# Patient Record
Sex: Female | Born: 1960
Health system: Southern US, Community
[De-identification: ages and names within clinical notes are randomized; demographics above are authoritative.]

## PROBLEM LIST (undated history)

## (undated) DIAGNOSIS — T7840XA Allergy, unspecified, initial encounter: Secondary | ICD-10-CM

## (undated) DIAGNOSIS — I10 Essential (primary) hypertension: Secondary | ICD-10-CM

## (undated) DIAGNOSIS — E7800 Pure hypercholesterolemia, unspecified: Secondary | ICD-10-CM

## (undated) HISTORY — PX: TONSILLECTOMY: SUR1361

## (undated) HISTORY — PX: TUBAL LIGATION: SHX77

## (undated) HISTORY — DX: Allergy, unspecified, initial encounter: T78.40XA

---

## 2014-06-10 ENCOUNTER — Emergency Department (HOSPITAL_COMMUNITY)
Admission: EM | Admit: 2014-06-10 | Discharge: 2014-06-10 | Disposition: A | Discharge status: Home / Self Care | Payer: BC Managed Care – PPO | Attending: Emergency Medicine | Admitting: Emergency Medicine

## 2014-06-10 ENCOUNTER — Emergency Department (HOSPITAL_COMMUNITY): Payer: BC Managed Care – PPO

## 2014-06-10 ENCOUNTER — Encounter (HOSPITAL_COMMUNITY): Payer: Self-pay | Admitting: Emergency Medicine

## 2014-06-10 DIAGNOSIS — Z8639 Personal history of other endocrine, nutritional and metabolic disease: Secondary | ICD-10-CM | POA: Diagnosis not present

## 2014-06-10 DIAGNOSIS — R42 Dizziness and giddiness: Secondary | ICD-10-CM

## 2014-06-10 DIAGNOSIS — I158 Other secondary hypertension: Secondary | ICD-10-CM | POA: Insufficient documentation

## 2014-06-10 DIAGNOSIS — Z7982 Long term (current) use of aspirin: Secondary | ICD-10-CM | POA: Diagnosis not present

## 2014-06-10 DIAGNOSIS — Z79899 Other long term (current) drug therapy: Secondary | ICD-10-CM | POA: Diagnosis not present

## 2014-06-10 DIAGNOSIS — Z862 Personal history of diseases of the blood and blood-forming organs and certain disorders involving the immune mechanism: Secondary | ICD-10-CM | POA: Diagnosis not present

## 2014-06-10 DIAGNOSIS — I159 Secondary hypertension, unspecified: Secondary | ICD-10-CM

## 2014-06-10 HISTORY — DX: Essential (primary) hypertension: I10

## 2014-06-10 HISTORY — DX: Pure hypercholesterolemia, unspecified: E78.00

## 2014-06-10 LAB — BASIC METABOLIC PANEL
ANION GAP: 14 (ref 5–15)
BUN: 20 mg/dL (ref 6–23)
CHLORIDE: 100 meq/L (ref 96–112)
CO2: 26 meq/L (ref 19–32)
Calcium: 10.2 mg/dL (ref 8.4–10.5)
Creatinine, Ser: 0.93 mg/dL (ref 0.50–1.10)
GFR calc Af Amer: 79 mL/min — ABNORMAL LOW (ref >90)
GFR calc non Af Amer: 68 mL/min — ABNORMAL LOW (ref >90)
Glucose, Bld: 96 mg/dL (ref 70–99)
Potassium: 4.2 mEq/L (ref 3.7–5.3)
Sodium: 140 mEq/L (ref 137–147)

## 2014-06-10 LAB — URINALYSIS, ROUTINE W REFLEX MICROSCOPIC
Bilirubin Urine: NEGATIVE
Glucose, UA: NEGATIVE mg/dL
Hgb urine dipstick: NEGATIVE
KETONES UR: NEGATIVE mg/dL
LEUKOCYTES UA: NEGATIVE
NITRITE: NEGATIVE
Protein, ur: NEGATIVE mg/dL
SPECIFIC GRAVITY, URINE: 1.008 (ref 1.005–1.030)
Urobilinogen, UA: 0.2 mg/dL (ref 0.0–1.0)
pH: 6.5 (ref 5.0–8.0)

## 2014-06-10 LAB — CBC WITH DIFFERENTIAL/PLATELET
Basophils Absolute: 0 10*3/uL (ref 0.0–0.1)
Basophils Relative: 1 % (ref 0–1)
Eosinophils Absolute: 0.2 10*3/uL (ref 0.0–0.7)
Eosinophils Relative: 3 % (ref 0–5)
HCT: 38.4 % (ref 36.0–46.0)
Hemoglobin: 13.6 g/dL (ref 12.0–15.0)
LYMPHS PCT: 38 % (ref 12–46)
Lymphs Abs: 2.2 10*3/uL (ref 0.7–4.0)
MCH: 30.7 pg (ref 26.0–34.0)
MCHC: 35.4 g/dL (ref 30.0–36.0)
MCV: 86.7 fL (ref 78.0–100.0)
Monocytes Absolute: 0.3 10*3/uL (ref 0.1–1.0)
Monocytes Relative: 6 % (ref 3–12)
NEUTROS ABS: 3.2 10*3/uL (ref 1.7–7.7)
NEUTROS PCT: 52 % (ref 43–77)
Platelets: 329 10*3/uL (ref 150–400)
RBC: 4.43 MIL/uL (ref 3.87–5.11)
RDW: 12.6 % (ref 11.5–15.5)
WBC: 5.9 10*3/uL (ref 4.0–10.5)

## 2014-06-10 LAB — I-STAT TROPONIN, ED: TROPONIN I, POC: 0 ng/mL (ref 0.00–0.08)

## 2014-06-10 MED ORDER — SODIUM CHLORIDE 0.9 % IV BOLUS (SEPSIS)
500.0000 mL | Freq: Once | INTRAVENOUS | Status: AC
  Administered 2014-06-10: 500 mL via INTRAVENOUS

## 2014-06-10 NOTE — ED Notes (Signed)
Initial contact-A&Ox4. Moving all extremities. C/o slight dizziness at this time. Denies SOB, chest pain at this time. PIV inserted. No other complaints/concerns. Aware urine sample is needed. Does not need to use the restroom at this time. Will continue to monitor.

## 2014-06-10 NOTE — ED Notes (Addendum)
Pt c/o HTN and dizziness x 0630 this morning. Denies pain. Pt does take lisinopril. Pt went to [facility] and was given zofran for nausea and that has ceased.

## 2014-06-10 NOTE — Discharge Instructions (Signed)
Near-Syncope Near-syncope (commonly known as near fainting) is sudden weakness, dizziness, or feeling like you might pass out. During an episode of near-syncope, you may also develop pale skin, have tunnel vision, or feel sick to your stomach (nauseous). Near-syncope may occur when getting up after sitting or while standing for a long time. It is caused by a sudden decrease in blood flow to the brain. This decrease can result from various causes or triggers, most of which are not serious. However, because near-syncope can sometimes be a sign of something serious, a medical evaluation is required. The specific cause is often not determined. HOME CARE INSTRUCTIONS  Monitor your condition for any changes. The following actions may help to alleviate any discomfort you are experiencing:  Have someone stay with you until you feel stable.  Lie down right away and prop your feet up if you start feeling like you might faint. Breathe deeply and steadily. Wait until all the symptoms have passed. Most of these episodes last only a few minutes. You may feel tired for several hours.   Drink enough fluids to keep your urine clear or pale yellow.   If you are taking blood pressure or heart medicine, get up slowly when seated or lying down. Take several minutes to sit and then stand. This can reduce dizziness.  Follow up with your health care provider as directed. SEEK IMMEDIATE MEDICAL CARE IF:   You have a severe headache.   You have unusual pain in the chest, abdomen, or back.   You are bleeding from the mouth or rectum, or you have black or tarry stool.   You have an irregular or very fast heartbeat.   You have repeated fainting or have seizure-like jerking during an episode.   You faint when sitting or lying down.   You have confusion.   You have difficulty walking.   You have severe weakness.   You have vision problems.  MAKE SURE YOU:   Understand these instructions.  Will  watch your condition.  Will get help right away if you are not doing well or get worse. Document Released: 09/17/2005 Document Revised: 09/22/2013 Document Reviewed: 02/20/2013 [facility] Patient Information 2015 [place], [place]. This information is not intended to replace advice given to you by your health care provider. Make sure you discuss any questions you have with your health care provider. Hypertension Hypertension, commonly called high blood pressure, is when the force of blood pumping through your arteries is too strong. Your arteries are the blood vessels that carry blood from your heart throughout your body. A blood pressure reading consists of a higher number over a lower number, such as 110/72. The higher number (systolic) is the pressure inside your arteries when your heart pumps. The lower number (diastolic) is the pressure inside your arteries when your heart relaxes. Ideally you want your blood pressure below 120/80. Hypertension forces your heart to work harder to pump blood. Your arteries may become narrow or stiff. Having hypertension puts you at risk for heart disease, stroke, and other problems.  RISK FACTORS Some risk factors for high blood pressure are controllable. Others are not.  Risk factors you cannot control include:   Race. You may be at higher risk if you are African American.  Age. Risk increases with age.  Gender. Men are at higher risk than women before age 16 years. After age 11, women are at higher risk than men. Risk factors you can control include:  Not getting enough exercise or  physical activity.  Being overweight.  Getting too much fat, sugar, calories, or salt in your diet.  Drinking too much alcohol. SIGNS AND SYMPTOMS Hypertension does not usually cause signs or symptoms. Extremely high blood pressure (hypertensive crisis) may cause headache, anxiety, shortness of breath, and nosebleed. DIAGNOSIS  To check if you have hypertension, your health  care provider will measure your blood pressure while you are seated, with your arm held at the level of your heart. It should be measured at least twice using the same arm. Certain conditions can cause a difference in blood pressure between your right and left arms. A blood pressure reading that is higher than normal on one occasion does not mean that you need treatment. If one blood pressure reading is high, ask your health care provider about having it checked again. TREATMENT  Treating high blood pressure includes making lifestyle changes and possibly taking medicine. Living a healthy lifestyle can help lower high blood pressure. You may need to change some of your habits. Lifestyle changes may include:  Following the DASH diet. This diet is high in fruits, vegetables, and whole grains. It is low in salt, red meat, and added sugars.  Getting at least 2 hours of brisk physical activity every week.  Losing weight if necessary.  Not smoking.  Limiting alcoholic beverages.  Learning ways to reduce stress. If lifestyle changes are not enough to get your blood pressure under control, your health care provider may prescribe medicine. You may need to take more than one. Work closely with your health care provider to understand the risks and benefits. HOME CARE INSTRUCTIONS  Have your blood pressure rechecked as directed by your health care provider.   Take medicines only as directed by your health care provider. Follow the directions carefully. Blood pressure medicines must be taken as prescribed. The medicine does not work as well when you skip doses. Skipping doses also puts you at risk for problems.   Do not smoke.   Monitor your blood pressure at home as directed by your health care provider. SEEK MEDICAL CARE IF:   You think you are having a reaction to medicines taken.  You have recurrent headaches or feel dizzy.  You have swelling in your ankles.  You have trouble with your  vision. SEEK IMMEDIATE MEDICAL CARE IF:  You develop a severe headache or confusion.  You have unusual weakness, numbness, or feel faint.  You have severe chest or abdominal pain.  You vomit repeatedly.  You have trouble breathing. MAKE SURE YOU:   Understand these instructions.  Will watch your condition.  Will get help right away if you are not doing well or get worse. Document Released: 09/17/2005 Document Revised: 02/01/2014 Document Reviewed: 07/10/2013 [facility] Patient Information 2015 [place], [place]. This information is not intended to replace advice given to you by your health care provider. Make sure you discuss any questions you have with your health care provider.

## 2014-06-10 NOTE — ED Provider Notes (Signed)
CSN: [id number]     Arrival date & time 06/10/14  4098 History   First MD Initiated Contact with Patient 06/10/14 1121     Chief Complaint  Patient presents with  . Hypertension  . Dizziness     (Consider location/radiation/quality/duration/timing/severity/associated sxs/prior Treatment) HPI Comments: Patient states she woke up about 5 AM with nausea. At about 6:30 AM she went from sitting to standing and became lightheaded and felt like she was going to pass out. This sensation lasted for about 30 minutes. She presented to the local urgent care where she had an EKG, was given aspirin and Zofran and was asked to present to the emergency department. She denies chest pain, shortness of breath, focal numbness, weakness, fevers, chills, myalgias or recent illnesses. She states urgent care her blood pressure was much more elevated than it usually is with a  Read of 159/98. She has since taken her home lisinopril  Patient is a 53 y.o. female presenting with dizziness and near-syncope. The history is provided by the patient. No language interpreter was used.  Dizziness Associated symptoms: nausea   Associated symptoms: no chest pain, no diarrhea, no headaches, no palpitations, no shortness of breath and no vomiting   Near Syncope This is a new problem. The current episode started 3 to 5 hours ago. The problem occurs rarely. The problem has been resolved. Pertinent negatives include no chest pain, no abdominal pain, no headaches and no shortness of breath. Exacerbated by: standing. The symptoms are relieved by rest. She has tried rest for the symptoms. The treatment provided significant relief.    Past Medical History  Diagnosis Date  . Hypertension   . Hypercholesteremia    Past Surgical History  Procedure Laterality Date  . Tubal ligation    . Tonsillectomy     No family history on file. History  Substance Use Topics  . Smoking status: Never Smoker   . Smokeless tobacco: Not on file  .  Alcohol Use: Yes     Comment: social   OB History   Grav Para Term Preterm Abortions TAB SAB Ect Mult Living                 Review of Systems  Constitutional: Negative for fever, chills, diaphoresis, activity change, appetite change and fatigue.  HENT: Negative for congestion, facial swelling, rhinorrhea and sore throat.   Eyes: Negative for photophobia and discharge.  Respiratory: Negative for cough, chest tightness and shortness of breath.   Cardiovascular: Positive for near-syncope. Negative for chest pain, palpitations and leg swelling.  Gastrointestinal: Positive for nausea. Negative for vomiting, abdominal pain and diarrhea.  Endocrine: Negative for polydipsia and polyuria.  Genitourinary: Negative for dysuria, frequency, difficulty urinating and pelvic pain.  Musculoskeletal: Negative for arthralgias, back pain, neck pain and neck stiffness.  Skin: Negative for color change and wound.  Allergic/Immunologic: Negative for immunocompromised state.  Neurological: Positive for dizziness. Negative for facial asymmetry, weakness, numbness and headaches.  Hematological: Does not bruise/bleed easily.  Psychiatric/Behavioral: Negative for confusion and agitation.      Allergies  Review of patient's allergies indicates no known allergies.  Home Medications   Prior to Admission medications   Medication Sig Start Date End Date Taking? Authorizing Provider  aspirin EC 81 MG tablet Take 81 mg by mouth once.   Yes Historical Provider, MD  lisinopril-hydrochlorothiazide (PRINZIDE,ZESTORETIC) 10-12.5 MG per tablet Take 1 tablet by mouth daily.   Yes Historical Provider, MD   BP 146/78  Pulse 67  Temp(Src) 98 F (36.7 C) (Oral)  Resp 11  SpO2 100% Physical Exam  Constitutional: She is oriented to person, place, and time. She appears well-developed and well-nourished. No distress.  HENT:  Head: Normocephalic and atraumatic.  Mouth/Throat: No oropharyngeal exudate.  Eyes: Pupils  are equal, round, and reactive to light.  Neck: Normal range of motion. Neck supple.  Cardiovascular: Normal rate, regular rhythm and normal heart sounds.  Exam reveals no gallop and no friction rub.   No murmur heard. Pulmonary/Chest: Effort normal and breath sounds normal. No respiratory distress. She has no wheezes. She has no rales.  Abdominal: Soft. Bowel sounds are normal. She exhibits no distension and no mass. There is no tenderness. There is no rebound and no guarding.  Musculoskeletal: Normal range of motion. She exhibits no edema and no tenderness.  Neurological: She is alert and oriented to person, place, and time. She has normal strength. She displays no tremor. No cranial nerve deficit or sensory deficit. She exhibits normal muscle tone. She displays a negative Romberg sign. Coordination and gait normal. GCS eye subscore is 4. GCS verbal subscore is 5. GCS motor subscore is 6.  Skin: Skin is warm and dry.  Psychiatric: She has a normal mood and affect.    ED Course  Procedures (including critical care time) Labs Review Labs Reviewed  BASIC METABOLIC PANEL - Abnormal; Notable for the following:    GFR calc non Af Amer 68 (*)    GFR calc Af Amer 79 (*)    All other components within normal limits  URINE CULTURE  CBC WITH DIFFERENTIAL  URINALYSIS, ROUTINE W REFLEX MICROSCOPIC  I-STAT TROPOININ, ED    Imaging Review Dg Chest 2 View  06/10/2014   CLINICAL DATA:  Hypertension.  Near syncopal episode.  EXAM: CHEST  2 VIEW  COMPARISON:  None.  FINDINGS: The heart size and mediastinal contours are within normal limits. Both lungs are clear. The visualized skeletal structures are unremarkable.  IMPRESSION: No active cardiopulmonary disease.   Electronically Signed   By: Lawrence Santiago M.D.   On: 06/10/2014 13:42     EKG Interpretation   Date/Time:  Thursday June 10 2014 11:38:49 EDT Ventricular Rate:  68 PR Interval:  148 QRS Duration: 71 QT Interval:  401 QTC  Calculation: 426 R Axis:   55 Text Interpretation:  Sinus rhythm Baseline wander in lead(s) II III aVF  No prior for comparison Confirmed by DOCHERTY  MD, MEGAN ([id number]) on  06/10/2014 12:05:32 PM      MDM   Final diagnoses:  Episodic lightheadedness  Secondary hypertension, unspecified    Pt is a 53 y.o. female with Pmhx as above who presents with lightheadedness. Patient states she woke up around 5:30 this morning and was nauseated and then at approximately 6:30 this morning she went from sitting to standing and became lightheaded feeling like she was going to pass out. She denies having chest pain, palpitations, shortness of breath, diaphoresis. She states that she felt lightheaded for about 30 minutes. She went to urgent care where her blood pressure was elevated. She was given aspirin and Zofran with improvement of symptoms. On physical exam vitals are stable she is in no acute distress. Cardiopulmonary informed her exam is benign. She is asymptomatic with ambulation and is not ataxic. EKG with no acute ischemic findings. CBC, BMP, UA unremarkable. I-STAT troponin neg, chest x-ray neg. Symptoms resolved after 500cc IVF. Orthostatics weakly positive. Will d/c home, and instruct her to start a  BP long, f/u with her PCP. Return precautions given for new or worsening symptoms including CP, SOB, numbness, weakness.          Ernestina Patches, MD 06/10/14 [id number]

## 2014-06-11 LAB — URINE CULTURE

## 2014-12-29 ENCOUNTER — Ambulatory Visit (INDEPENDENT_AMBULATORY_CARE_PROVIDER_SITE_OTHER): Payer: 59 | Admitting: Family Medicine

## 2014-12-29 ENCOUNTER — Encounter: Payer: Self-pay | Admitting: Family Medicine

## 2014-12-29 ENCOUNTER — Ambulatory Visit (INDEPENDENT_AMBULATORY_CARE_PROVIDER_SITE_OTHER): Payer: 59

## 2014-12-29 VITALS — BP 124/76 | HR 84 | Temp 98.2°F | Resp 17 | Ht 64.0 in | Wt 136.0 lb

## 2014-12-29 DIAGNOSIS — R52 Pain, unspecified: Secondary | ICD-10-CM | POA: Diagnosis not present

## 2014-12-29 DIAGNOSIS — R6883 Chills (without fever): Secondary | ICD-10-CM

## 2014-12-29 DIAGNOSIS — R002 Palpitations: Secondary | ICD-10-CM | POA: Diagnosis not present

## 2014-12-29 LAB — POCT INFLUENZA A/B
INFLUENZA A, POC: NEGATIVE
Influenza B, POC: NEGATIVE

## 2014-12-29 LAB — COMPREHENSIVE METABOLIC PANEL
ALK PHOS: 79 U/L (ref 39–117)
ALT: 19 U/L (ref 0–35)
AST: 20 U/L (ref 0–37)
Albumin: 4.3 g/dL (ref 3.5–5.2)
BUN: 23 mg/dL (ref 6–23)
CALCIUM: 9.8 mg/dL (ref 8.4–10.5)
CO2: 30 mEq/L (ref 19–32)
CREATININE: 0.94 mg/dL (ref 0.50–1.10)
Chloride: 101 mEq/L (ref 96–112)
Glucose, Bld: 84 mg/dL (ref 70–99)
Potassium: 4.2 mEq/L (ref 3.5–5.3)
SODIUM: 141 meq/L (ref 135–145)
Total Bilirubin: 0.5 mg/dL (ref 0.2–1.2)
Total Protein: 6.9 g/dL (ref 6.0–8.3)

## 2014-12-29 LAB — POCT CBC
Granulocyte percent: 61.3 %G (ref 37–80)
HEMATOCRIT: 42.1 % (ref 37.7–47.9)
HEMOGLOBIN: 13.6 g/dL (ref 12.2–16.2)
Lymph, poc: 2.3 (ref 0.6–3.4)
MCH: 29.4 pg (ref 27–31.2)
MCHC: 32.3 g/dL (ref 31.8–35.4)
MCV: 90.8 fL (ref 80–97)
MID (cbc): 0.5 (ref 0–0.9)
MPV: 6.9 fL (ref 0–99.8)
POC Granulocyte: 4.4 (ref 2–6.9)
POC LYMPH PERCENT: 31.8 %L (ref 10–50)
POC MID %: 6.9 %M (ref 0–12)
Platelet Count, POC: 371 10*3/uL (ref 142–424)
RBC: 4.64 M/uL (ref 4.04–5.48)
RDW, POC: 13.6 %
WBC: 7.1 10*3/uL (ref 4.6–10.2)

## 2014-12-29 LAB — TSH: TSH: 0.723 u[IU]/mL (ref 0.350–4.500)

## 2014-12-29 NOTE — Progress Notes (Signed)
[facility] [address] [place] [address] [phone]  Date:  12/29/2014   Name:  Haley Vega   DOB:  1961-05-02   MRN:  [id number]  PCP:  Helane Rima, MD    Chief Complaint: Fatigue; Chills; and Palpitations   History of Present Illness:  Haley Vega is a 54 y.o. very pleasant female patient who presents with the following:  She has noted chills, body aches, heart racing.   She has been sick for about 5 days now.  She is not sure about a fever.  She has tried some ibuprofen and aleve when she feels bad, has not checked her temperature so she does not know about fever. No cough, no ST, no URI sx.   She notes aches in her chest and back when she lays down at night.   She did take some ibuprofen at 0300 this am.   No GI symptoms.   She has a history of HTN but no other heart problems.  No history of exertional CP.   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   Past Medical History  Diagnosis Date  . Hypertension   . Hypercholesteremia     Past Surgical History  Procedure Laterality Date  . Tubal ligation    . Tonsillectomy      History  Substance Use Topics  . Smoking status: Never Smoker   . Smokeless tobacco: Not on file  . Alcohol Use: Yes     Comment: social    No family history on file.  No Known Allergies  Medication list has been reviewed and updated.  Current Outpatient Prescriptions on File Prior to Visit  Medication Sig Dispense Refill  . aspirin EC 81 MG tablet Take 81 mg by mouth once.    Marland Kitchen lisinopril-hydrochlorothiazide (PRINZIDE,ZESTORETIC) 10-12.5 MG per tablet Take 1 tablet by mouth daily.     No current facility-administered medications on file prior to visit.    Review of Systems:  As per HPI- otherwise negative.   Physical Examination: Filed Vitals:   12/29/14 0846  BP: 124/76  Pulse:  84  Temp: 98.2 F (36.8 C)  Resp: 17   Filed Vitals:   12/29/14 0846  Height: 5\' 4"  (1.626 m)  Weight: 136 lb (61.689 kg)   Body mass index is 23.33 kg/(m^2). Ideal Body Weight: Weight in (lb) to have BMI = 25: 145.3  GEN: WDWN, NAD, Non-toxic, A & O x 3, looks well, normal weight HEENT: Atraumatic, Normocephalic. Neck supple. No masses, No LAD.  Bilateral TM wnl, oropharynx normal.  PEERL,EOMI.   Ears and Nose: No external deformity. CV: RRR, No M/G/R. No JVD. No thrill. No extra heart sounds. PULM: CTA B, no wheezes, crackles, rhonchi. No retractions. No resp. distress. No accessory muscle use. ABD: S, NT, ND, +BS. No rebound. No HSM.  Benign belly EXTR: No c/c/e NEURO Normal gait.  PSYCH: Normally interactive. Conversant. Not depressed or anxious appearing.  Calm demeanor.   EKG:  NSR with rate of 70  [facility] reading (PRIMARY) by  Dr. Lorelei Pont. CXR:  Negative CHEST 2 VIEW  COMPARISON: None.  FINDINGS: The lungs are well-expanded and clear. The heart and mediastinal structures are normal. The pulmonary vascularity is normal. There is no pleural effusion. The bony thorax is unremarkable.  IMPRESSION: There is no active cardiopulmonary  disease.    Results for orders placed or performed in visit on 12/29/14  POCT Influenza A/B  Result Value Ref Range   Influenza A, POC Negative    Influenza B, POC Negative   POCT CBC  Result Value Ref Range   WBC 7.1 4.6 - 10.2 K/uL   Lymph, poc 2.3 0.6 - 3.4   POC LYMPH PERCENT 31.8 10 - 50 %L   MID (cbc) 0.5 0 - 0.9   POC MID % 6.9 0 - 12 %M   POC Granulocyte 4.4 2 - 6.9   Granulocyte percent 61.3 37 - 80 %G   RBC 4.64 4.04 - 5.48 M/uL   Hemoglobin 13.6 12.2 - 16.2 g/dL   HCT, POC 42.1 37.7 - 47.9 %   MCV 90.8 80 - 97 fL   MCH, POC 29.4 27 - 31.2 pg   MCHC 32.3 31.8 - 35.4 g/dL   RDW, POC 13.6 %   Platelet Count, POC 371 142 - 424 K/uL   MPV 6.9 0 - 99.8 fL    Assessment and Plan: Chills - Plan: POCT Influenza A/B,  POCT CBC, Comprehensive metabolic panel, DG Chest 2 View  Body aches - Plan: POCT Influenza A/B, DG Chest 2 View  Palpitations - Plan: EKG 12-Lead, TSH  Non- specific sx of heart racing, chills and fatigue.  Advised that at this time no definite cause is evident.  She will rest and drink plenty of fluids, and I will be in touch with the rest of her labs.  Any worsening and she will let me know See patient instructions for more details.     Signed Lamar Blinks, MD

## 2014-12-29 NOTE — Patient Instructions (Signed)
Take it easy, drink plenty of fluids and eat a good diet.  Try to get plenty of rest.  I will be in touch with the rest of your labs If you feel chilled or ill check your temperature, and try to take your pulse if you feel that your heart is racing. If your symptoms continue to bother you we can look further.

## 2015-01-03 ENCOUNTER — Encounter: Payer: Self-pay | Admitting: Family Medicine

## 2015-01-03 ENCOUNTER — Ambulatory Visit (INDEPENDENT_AMBULATORY_CARE_PROVIDER_SITE_OTHER): Payer: 59 | Admitting: Family Medicine

## 2015-01-03 VITALS — BP 154/90 | HR 82 | Temp 98.2°F | Resp 16 | Ht 64.0 in | Wt 135.0 lb

## 2015-01-03 DIAGNOSIS — I10 Essential (primary) hypertension: Secondary | ICD-10-CM | POA: Diagnosis not present

## 2015-01-03 DIAGNOSIS — E7800 Pure hypercholesterolemia, unspecified: Secondary | ICD-10-CM

## 2015-01-03 DIAGNOSIS — E785 Hyperlipidemia, unspecified: Secondary | ICD-10-CM | POA: Insufficient documentation

## 2015-01-03 LAB — LIPID PANEL
CHOL/HDL RATIO: 3.1 ratio
Cholesterol: 275 mg/dL — ABNORMAL HIGH (ref 0–200)
HDL: 88 mg/dL (ref >=46)
LDL Cholesterol: 172 mg/dL — ABNORMAL HIGH (ref 0–99)
TRIGLYCERIDES: 74 mg/dL (ref <150)
VLDL: 15 mg/dL (ref 0–40)

## 2015-01-03 MED ORDER — AMLODIPINE BESYLATE 5 MG PO TABS: 5.0000 mg | ORAL_TABLET | Freq: Every day | ORAL | Status: DC

## 2015-01-03 NOTE — Patient Instructions (Signed)
Stop lisinopril- HCTZ Start amlodipine Check your blood pressure several times a week and keep a log How to Take Your Blood Pressure HOW DO I GET A BLOOD PRESSURE MACHINE?  You can buy an electronic home blood pressure machine at your local pharmacy. Insurance will sometimes cover the cost if you have a prescription.  Ask your doctor what type of machine is best for you. There are different machines for your arm and your wrist.  If you decide to buy a machine to check your blood pressure on your arm, first check the size of your arm so you can buy the right size cuff. To check the size of your arm:   Use a measuring tape that shows both inches and centimeters.   Wrap the measuring tape around the upper-middle part of your arm. You may need someone to help you measure.   Write down your arm measurement in both inches and centimeters.   To measure your blood pressure correctly, it is important to have the right size cuff.   If your arm is up to 13 inches (up to 34 centimeters), get an adult cuff size.  If your arm is 13 to 17 inches (35 to 44 centimeters), get a large adult cuff size.    If your arm is 17 to 20 inches (45 to 52 centimeters), get an adult thigh cuff.  WHAT DO THE NUMBERS MEAN?   There are two numbers that make up your blood pressure. For example: 120/80.  The first number (120 in our example) is called the "systolic pressure." It is a measure of the pressure in your blood vessels when your heart is pumping blood.  The second number (80 in our example) is called the "diastolic pressure." It is a measure of the pressure in your blood vessels when your heart is resting between beats.  Your doctor will tell you what your blood pressure should be. WHAT SHOULD I DO BEFORE I CHECK MY BLOOD PRESSURE?   Try to rest or relax for at least 30 minutes before you check your blood pressure.  Do not smoke.  Do not have any drinks with caffeine, such  as:  Soda.  Coffee.  Tea.  Check your blood pressure in a quiet room.  Sit down and stretch out your arm on a table. Keep your arm at about the level of your heart. Let your arm relax.  Make sure that your legs are not crossed. HOW DO I CHECK MY BLOOD PRESSURE?  Follow the directions that came with your machine.  Make sure you remove any tight-fighting clothing from your arm or wrist. Wrap the cuff around your upper arm or wrist. You should be able to fit a finger between the cuff and your arm. If you cannot fit a finger between the cuff and your arm, it is too tight and should be removed and rewrapped.  Some units require you to manually pump up the arm cuff.  Automatic units inflate the cuff when you press a button.  Cuff deflation is automatic in both models.  After the cuff is inflated, the unit measures your blood pressure and pulse. The readings are shown on a monitor. Hold still and breathe normally while the cuff is inflated.  Getting a reading takes less than a minute.  Some models store readings in a memory. Some provide a printout of readings. If your machine does not store your readings, keep a written record.  Take readings with you to your  next visit with your doctor. Document Released: 08/30/2008 Document Revised: 02/01/2014 Document Reviewed: 11/12/2013 [facility] Patient Information 2015 [place], [place]. This information is not intended to replace advice given to you by your health care provider. Make sure you discuss any questions you have with your health care provider.

## 2015-01-03 NOTE — Progress Notes (Signed)
   Subjective:    Patient ID: Haley Vega, female    DOB: 07/13/1961, 54 y.o.   MRN: [id number]  HPI This is a very pleasant female who works at the [employer] as a Research scientist (physical sciences). She presents today for follow up of palpitations. She was seen 12/29/14 with some chills and palpitations. She has not had any further episodes. Her labs, including CBC, CMET, TSH were all within normal limits. She is fasting today and would like her lipid panel checked.   She takes prinzide and reports that she has bad leg cramps at night. She would like to try a different antihypertensive. She has a blood pressure cuff at home and can check her blood pressure. She did not take her prinzide today.   Walks twice a week, will walk more when the weather improves.  Past Medical History  Diagnosis Date  . Hypertension   . Hypercholesteremia    Past Surgical History  Procedure Laterality Date  . Tubal ligation    . Tonsillectomy     History reviewed. No pertinent family history. History  Substance Use Topics  . Smoking status: Never Smoker   . Smokeless tobacco: Not on file  . Alcohol Use: Yes     Comment: social    Review of Systems No chest pain, no SOB, no edema.    Objective:   Physical Exam  Constitutional: She is oriented to person, place, and time. She appears well-developed and well-nourished.  HENT:  Head: Normocephalic and atraumatic.  Eyes: Conjunctivae are normal.  Neck: Normal range of motion. Neck supple.  Cardiovascular: Normal rate, regular rhythm and normal heart sounds.   Pulmonary/Chest: Effort normal and breath sounds normal.  Musculoskeletal: Normal range of motion. She exhibits no edema.  Neurological: She is alert and oriented to person, place, and time.  Skin: Skin is warm and dry.  Psychiatric: She has a normal mood and affect. Her behavior is normal. Judgment and thought content normal.  Vitals reviewed.  BP 154/90 mmHg  Pulse 82  Temp(Src) 98.2 F (36.8  C)  Resp 16  Ht 5\' 4"  (1.626 m)  Wt 135 lb (61.236 kg)  BMI 23.16 kg/m2  SpO2 100%     Assessment & Plan:  1. Essential hypertension - amLODipine (NORVASC) 5 MG tablet; Take 1 tablet (5 mg total) by mouth daily.  Dispense: 30 tablet; Refill: 1 - she will monitor her blood pressure periodically and bring her readings in when she has a follow up appointment in 1 month  2. Elevated cholesterol - Lipid panel   Elby Beck, FNP-BC  [facility] [place]  01/07/2015 10:39 PM

## 2015-02-14 ENCOUNTER — Ambulatory Visit (INDEPENDENT_AMBULATORY_CARE_PROVIDER_SITE_OTHER): Payer: 59 | Admitting: Family Medicine

## 2015-02-14 ENCOUNTER — Encounter: Payer: Self-pay | Admitting: Family Medicine

## 2015-02-14 VITALS — BP 124/90 | HR 73 | Temp 98.0°F | Resp 16 | Ht 64.0 in | Wt 135.8 lb

## 2015-02-14 DIAGNOSIS — Z1239 Encounter for other screening for malignant neoplasm of breast: Secondary | ICD-10-CM

## 2015-02-14 DIAGNOSIS — Z0000 Encounter for general adult medical examination without abnormal findings: Secondary | ICD-10-CM | POA: Diagnosis not present

## 2015-02-14 DIAGNOSIS — I10 Essential (primary) hypertension: Secondary | ICD-10-CM

## 2015-02-14 DIAGNOSIS — Z139 Encounter for screening, unspecified: Secondary | ICD-10-CM | POA: Diagnosis not present

## 2015-02-14 DIAGNOSIS — R252 Cramp and spasm: Secondary | ICD-10-CM

## 2015-02-14 DIAGNOSIS — E785 Hyperlipidemia, unspecified: Secondary | ICD-10-CM | POA: Diagnosis not present

## 2015-02-14 DIAGNOSIS — Z1389 Encounter for screening for other disorder: Secondary | ICD-10-CM

## 2015-02-14 LAB — COMPREHENSIVE METABOLIC PANEL
ALT: 21 U/L (ref 0–35)
AST: 22 U/L (ref 0–37)
Albumin: 4.6 g/dL (ref 3.5–5.2)
Alkaline Phosphatase: 79 U/L (ref 39–117)
BILIRUBIN TOTAL: 0.8 mg/dL (ref 0.2–1.2)
BUN: 22 mg/dL (ref 6–23)
CHLORIDE: 102 meq/L (ref 96–112)
CO2: 29 mEq/L (ref 19–32)
CREATININE: 0.92 mg/dL (ref 0.50–1.10)
Calcium: 9.7 mg/dL (ref 8.4–10.5)
Glucose, Bld: 89 mg/dL (ref 70–99)
Potassium: 4.1 mEq/L (ref 3.5–5.3)
Sodium: 138 mEq/L (ref 135–145)
Total Protein: 7.5 g/dL (ref 6.0–8.3)

## 2015-02-14 LAB — CBC
HCT: 39.9 % (ref 36.0–46.0)
Hemoglobin: 13.8 g/dL (ref 12.0–15.0)
MCH: 29.9 pg (ref 26.0–34.0)
MCHC: 34.6 g/dL (ref 30.0–36.0)
MCV: 86.4 fL (ref 78.0–100.0)
MPV: 9 fL (ref 8.6–12.4)
Platelets: 360 K/uL (ref 150–400)
RBC: 4.62 MIL/uL (ref 3.87–5.11)
RDW: 13.9 % (ref 11.5–15.5)
WBC: 4.8 K/uL (ref 4.0–10.5)

## 2015-02-14 LAB — LIPID PANEL
CHOLESTEROL: 280 mg/dL — AB (ref 0–200)
HDL: 92 mg/dL (ref >=46)
LDL CALC: 173 mg/dL — AB (ref 0–99)
Total CHOL/HDL Ratio: 3 Ratio
Triglycerides: 76 mg/dL (ref <150)
VLDL: 15 mg/dL (ref 0–40)

## 2015-02-14 LAB — POCT URINALYSIS DIPSTICK
Bilirubin, UA: NEGATIVE
GLUCOSE UA: NEGATIVE
Ketones, UA: NEGATIVE
Leukocytes, UA: NEGATIVE
NITRITE UA: NEGATIVE
PROTEIN UA: NEGATIVE
RBC UA: NEGATIVE
Spec Grav, UA: 1.02
UROBILINOGEN UA: 0.2
pH, UA: 6

## 2015-02-14 MED ORDER — AMLODIPINE BESYLATE 5 MG PO TABS: 2.5000 mg | ORAL_TABLET | Freq: Every day | ORAL | Status: DC

## 2015-02-14 NOTE — Progress Notes (Signed)
   Subjective:    Patient ID: Haley Vega, female    DOB: 1961/06/19, 54 y.o.   MRN: [id number]  HPI    Review of Systems  Constitutional: Negative.   HENT: Negative.   Eyes: Negative.   Respiratory: Negative.   Cardiovascular: Negative.   Gastrointestinal: Negative.   Endocrine: Negative.   Genitourinary: Negative.   Musculoskeletal: Negative.   Skin: Negative.   Allergic/Immunologic: Negative.   Neurological: Negative.   Hematological: Negative.   Psychiatric/Behavioral: Negative.        Objective:   Physical Exam        Assessment & Plan:

## 2015-02-14 NOTE — Progress Notes (Signed)
Subjective:    Patient ID: Haley Vega, female    DOB: [birth year], 54 y.o.   MRN: [id number]  HPI This is a pleasant 54 yo female who presents today for CPE.  Last CPE- 10/2013- gyn Mammo- 10/2013 Pap- 10/2013, never abnormal Colonoscopy- 12/15, no polyps, f/u 5 yrs per report Tdap- 10/01/2014 Flu- annual Eye- not regular Dental- regular Exercise- walks regularly  Patient was changed from lisinopril to amlodipine last visit. She noticed some leg pain, so she decreased her dose to 1/2 tablet and leg pain resolved. She has been checking her blood pressure at home and reports good control.  Past Medical History  Diagnosis Date  . Hypertension   . Hypercholesteremia    Past Surgical History  Procedure Laterality Date  . Tubal ligation    . Tonsillectomy     History reviewed. No pertinent family history. History  Substance Use Topics  . Smoking status: Never Smoker   . Smokeless tobacco: Not on file  . Alcohol Use: 0.0 oz/week    0 Standard drinks or equivalent per week     Comment: social 1 drink     Review of Systems  Constitutional: Negative.   HENT: Negative.   Eyes: Negative.   Respiratory: Negative.   Cardiovascular: Negative.   Gastrointestinal: Negative.   Endocrine: Negative.   Genitourinary: Negative.   Musculoskeletal: Negative.   Skin: Negative.   Allergic/Immunologic: Negative.   Neurological: Negative.   Hematological: Negative.   Psychiatric/Behavioral: Negative.       Objective:   Physical Exam  Constitutional: She is oriented to person, place, and time. She appears well-developed and well-nourished. No distress.  HENT:  Head: Normocephalic and atraumatic.  Right Ear: External ear normal.  Left Ear: External ear normal.  Nose: Nose normal.  Mouth/Throat: Oropharynx is clear and moist. No oropharyngeal exudate.  Eyes: Conjunctivae are normal. Pupils are equal, round, and reactive to light.  Neck: Normal range of motion. Neck supple. No JVD  present. No thyromegaly present.  Cardiovascular: Normal rate, regular rhythm, normal heart sounds and intact distal pulses.   Pulmonary/Chest: Effort normal and breath sounds normal. Right breast exhibits no inverted nipple, no mass, no nipple discharge, no skin change and no tenderness. Left breast exhibits no inverted nipple, no mass, no nipple discharge, no skin change and no tenderness. Breasts are symmetrical.  Abdominal: Soft. Bowel sounds are normal. She exhibits no distension and no mass. There is no tenderness. There is no rebound and no guarding.  Genitourinary: Vagina normal. Pelvic exam was performed with patient supine. There is no rash, tenderness, lesion or injury on the right labia. There is no rash, tenderness, lesion or injury on the left labia. Cervix exhibits no motion tenderness and no discharge. No vaginal discharge found.  Musculoskeletal: Normal range of motion. She exhibits no edema or tenderness.  Lymphadenopathy:    She has no cervical adenopathy.  Neurological: She is alert and oriented to person, place, and time. She has normal reflexes.  Skin: Skin is warm and dry. She is not diaphoretic.  Psychiatric: She has a normal mood and affect. Her behavior is normal. Judgment and thought content normal.  Vitals reviewed.    BP 124/90 mmHg  Pulse 73  Temp(Src) 98 F (36.7 C) (Oral)  Resp 16  Ht 5\' 4"  (1.626 m)  Wt 135 lb 12.8 oz (61.598 kg)  BMI 23.30 kg/m2  SpO2 100%  Assessment & Plan:  1. Annual physical exam  2. Essential hypertension - CBC -  amLODipine (NORVASC) 5 MG tablet; Take 0.5 tablets (2.5 mg total) by mouth daily.  Dispense: 45 tablet; Refill: 1  3. HLD (hyperlipidemia) - Lipid panel  4. Screening for breast cancer - MM Digital Screening; Future  5. Screening for hematuria or proteinuria - POCT urinalysis dipstick  6. Cramp of both lower extremities - Comprehensive metabolic panel  - follow up in 6 months - encouraged continued regular  exercise, eye appointment, healthy food choices.   Clarene Reamer, FNP-BC  [facility]  02/14/2015 10:28 AM

## 2015-02-22 ENCOUNTER — Encounter: Payer: Self-pay | Admitting: Family Medicine

## 2015-03-03 ENCOUNTER — Ambulatory Visit
Admission: RE | Admit: 2015-03-03 | Discharge: 2015-03-03 | Disposition: A | Discharge status: Home / Self Care | Payer: 59 | Source: Ambulatory Visit | Attending: Family Medicine | Admitting: Family Medicine

## 2015-03-03 DIAGNOSIS — Z1239 Encounter for other screening for malignant neoplasm of breast: Secondary | ICD-10-CM

## 2015-05-26 ENCOUNTER — Ambulatory Visit (INDEPENDENT_AMBULATORY_CARE_PROVIDER_SITE_OTHER): Payer: 59 | Admitting: Family Medicine

## 2015-05-26 VITALS — BP 152/90 | HR 86 | Temp 98.0°F | Resp 18 | Wt 139.6 lb

## 2015-05-26 DIAGNOSIS — K921 Melena: Secondary | ICD-10-CM

## 2015-05-26 DIAGNOSIS — R42 Dizziness and giddiness: Secondary | ICD-10-CM

## 2015-05-26 DIAGNOSIS — I10 Essential (primary) hypertension: Secondary | ICD-10-CM

## 2015-05-26 LAB — COMPLETE METABOLIC PANEL WITH GFR
ALT: 24 U/L (ref 6–29)
AST: 23 U/L (ref 10–35)
Albumin: 4.5 g/dL (ref 3.6–5.1)
Alkaline Phosphatase: 83 U/L (ref 33–130)
BUN: 18 mg/dL (ref 7–25)
CO2: 27 mmol/L (ref 20–31)
Calcium: 9.8 mg/dL (ref 8.6–10.4)
Chloride: 102 mmol/L (ref 98–110)
Creat: 0.87 mg/dL (ref 0.50–1.05)
GFR, Est African American: 86 mL/min (ref >=60)
GFR, Est Non African American: 75 mL/min (ref >=60)
Glucose, Bld: 88 mg/dL (ref 65–99)
Potassium: 4.4 mmol/L (ref 3.5–5.3)
Sodium: 141 mmol/L (ref 135–146)
Total Bilirubin: 0.4 mg/dL (ref 0.2–1.2)
Total Protein: 6.9 g/dL (ref 6.1–8.1)

## 2015-05-26 LAB — POCT CBC
Granulocyte percent: 73.3 %G (ref 37–80)
HCT, POC: 41.2 % (ref 37.7–47.9)
Hemoglobin: 12.9 g/dL (ref 12.2–16.2)
Lymph, poc: 1.7 (ref 0.6–3.4)
MCH, POC: 27.7 pg (ref 27–31.2)
MCHC: 31.4 g/dL — AB (ref 31.8–35.4)
MCV: 88.2 fL (ref 80–97)
MID (cbc): 0.5 (ref 0–0.9)
MPV: 6.6 fL (ref 0–99.8)
POC Granulocyte: 6 (ref 2–6.9)
POC LYMPH PERCENT: 21 %L (ref 10–50)
POC MID %: 5.7 %M (ref 0–12)
Platelet Count, POC: 338 10*3/uL (ref 142–424)
RBC: 4.67 M/uL (ref 4.04–5.48)
RDW, POC: 13.2 %
WBC: 8.2 10*3/uL (ref 4.6–10.2)

## 2015-05-26 LAB — THYROID PANEL WITH TSH
Free Thyroxine Index: 2 (ref 1.4–3.8)
T3 Uptake: 30 % (ref 22–35)
T4, Total: 6.5 ug/dL (ref 4.5–12.0)
TSH: 0.43 u[IU]/mL (ref 0.350–4.500)

## 2015-05-26 LAB — IFOBT (OCCULT BLOOD): IFOBT: NEGATIVE

## 2015-05-26 MED ORDER — OMEPRAZOLE 20 MG PO CPDR: 20.0000 mg | DELAYED_RELEASE_CAPSULE | Freq: Every day | ORAL | Status: DC

## 2015-05-26 NOTE — Patient Instructions (Signed)
I'm going to treat you with some Prilosec because I think you have some gastritis. I would like you to stop the amlodipine for now and follow-up with your physician or back here at this office in the next week to see other blood pressures running off of all blood pressure medicine.

## 2015-05-26 NOTE — Progress Notes (Signed)
   Subjective:    Patient ID: Haley Vega, female    DOB: [birth year], 54 y.o.   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  She has been on amlodipine for several years, but has been breaking it in half lately because it makes her legs ache.  She has been taking Aleve for the discomfort.  (She had the same problem with lisinopril) She has had a couple days of epigastric pain and passed a dark stool today. Took laxative last night.  Had several BM's, only the last of which was dark.  No BM since noon.  She has been having nausea for two weeks, with epigastric discomfort starting 48 hours ago.  Colonoscopy 08/2014 was normal  She continues to walk 30 minutes per day.   Review of Systems Rapid heart beat No LOC    Objective:   Physical Exam BP 152/90 mmHg  Pulse 86  Temp(Src) 98 F (36.7 C) (Oral)  Resp 18  Wt 139 lb 9.6 oz (63.322 kg)  SpO2 99% Had EKG which she brought from the 7th of August which is NSR. NAD HEENT:  Normal Thyroid: not enlarged Chest:  Clear Heart:  Reg, no murmur BP lying:  130/80;  Standing 120/80 Abdomen:  Soft, nontender, without mass or HSM Ext:  No edema, normal DP pulses Skin warm and dry  Results for orders placed or performed in visit on 05/26/15  POCT CBC  Result Value Ref Range   WBC 8.2 4.6 - 10.2 K/uL   Lymph, poc 1.7 0.6 - 3.4   POC LYMPH PERCENT 21.0 10 - 50 %L   MID (cbc) 0.5 0 - 0.9   POC MID % 5.7 0 - 12 %M   POC Granulocyte 6.0 2 - 6.9   Granulocyte percent 73.3 37 - 80 %G   RBC 4.67 4.04 - 5.48 M/uL   Hemoglobin 12.9 12.2 - 16.2 g/dL   HCT, POC 41.2 37.7 - 47.9 %   MCV 88.2 80 - 97 fL   MCH, POC 27.7 27 - 31.2 pg   MCHC 31.4 (A) 31.8 - 35.4 g/dL   RDW, POC 13.2 %   Platelet Count, POC 338 142 - 424 K/uL   MPV 6.6 0 - 99.8 fL  IFOBT POC (occult bld, rslt in office)  Result Value Ref Range   IFOBT Negative       Assessment & Plan:  I'm  concerned the patient cannot tolerate either lisinopril or amlodipine, and is developed gastritis secondary to the Aleve which she's taking to relieve the side effects of the amlodipine.  For that reason putting her on some Prilosec and asking her to follow-up with her hypertension physician. I've asked her to hold off on taking any more blood pressure medicine for the time being.  Signed, Carola Frost.D.

## 2015-05-27 ENCOUNTER — Encounter: Payer: Self-pay | Admitting: Family Medicine

## 2015-06-20 ENCOUNTER — Encounter: Payer: Self-pay | Admitting: Family Medicine

## 2015-07-01 ENCOUNTER — Ambulatory Visit (INDEPENDENT_AMBULATORY_CARE_PROVIDER_SITE_OTHER): Payer: 59 | Admitting: Family Medicine

## 2015-07-01 VITALS — BP 124/82 | HR 94 | Temp 98.6°F | Resp 16 | Ht 64.0 in | Wt 139.0 lb

## 2015-07-01 DIAGNOSIS — I10 Essential (primary) hypertension: Secondary | ICD-10-CM

## 2015-07-01 DIAGNOSIS — Z119 Encounter for screening for infectious and parasitic diseases, unspecified: Secondary | ICD-10-CM

## 2015-07-01 MED ORDER — DILTIAZEM HCL ER 120 MG PO CP24: 120.0000 mg | ORAL_CAPSULE | Freq: Every day | ORAL | Status: DC

## 2015-07-01 NOTE — Patient Instructions (Signed)
Try the diltiazem instead of amlodipine for your blood pressure.  Let us know if you have any problems tolerating this medication Let's plan to recheck in about 3 month, but please keep me posted over mychart regarding your BP and symptoms

## 2015-07-01 NOTE — Progress Notes (Signed)
[facility] [address], [place] [address] [phone]  Date:  07/01/2015   Name:  Haley Vega   DOB:  1961-04-16   MRN:  [id number]  PCP:  Helane Rima, MD    Chief Complaint: Follow-up   History of Present Illness:  Haley Vega is a 54 y.o. very pleasant female patient who presents with the following:  Here today with concern about her BP medication.  She has been on amlodipine for a while, but takes it just intermittently as she has noted some SE- generally she notes pains and cramping in her legs at night after she takes the pill   Her BP 2 days ago was 170/100- this scared her so she took an amlodipine pill. Yesterday measured her BP at  132/83, then 105/70 after she went out for a walk She has been on BP medication for about 4 years- however she does not take it regularly  She had the same problems with leg cramps from lisinopirl and hydrochlorothizide that she tried in the past.   She is otherwise generally in good health She otherwise feels fine- no CP or SOB She will soon be changing jobs and will be under less stress so she hopes that her BP will be improved   BP Readings from Last 3 Encounters:  07/01/15 124/82  05/26/15 152/90  02/14/15 124/90   Pulse Readings from Last 3 Encounters:  07/01/15 94  05/26/15 86  02/14/15 73     Patient Active Problem List   Diagnosis Date Noted  . HLD (hyperlipidemia) 01/03/2015  . BP (high blood pressure) 01/03/2015    Past Medical History  Diagnosis Date  . Hypertension   . Hypercholesteremia     Past Surgical History  Procedure Laterality Date  . Tubal ligation    . Tonsillectomy      Social History  Substance Use Topics  . Smoking status: Never Smoker   . Smokeless tobacco: None  . Alcohol Use: 0.0 oz/week    0 Standard drinks or equivalent per week     Comment: social 1 drink    History reviewed. No pertinent family history.  Allergies  Allergen Reactions  . Influenza  Vaccines Swelling    Had significant swelling of face, lips and eyelids    Medication list has been reviewed and updated.  Current Outpatient Prescriptions on File Prior to Visit  Medication Sig Dispense Refill  . aspirin EC 81 MG tablet Take 81 mg by mouth once.    Marland Kitchen omeprazole (PRILOSEC) 20 MG capsule Take 1 capsule (20 mg total) by mouth daily. 30 capsule 3   No current facility-administered medications on file prior to visit.    Review of Systems:  As per HPI- otherwise negative.   Physical Examination: Filed Vitals:   07/01/15 1410  BP: 124/82  Pulse: 94  Temp: 98.6 F (37 C)  Resp: 16   Filed Vitals:   07/01/15 1410  Height: 5\' 4"  (1.626 m)  Weight: 139 lb (63.05 kg)   Body mass index is 23.85 kg/(m^2). Ideal Body Weight: Weight in (lb) to have BMI = 25: 145.3  GEN: WDWN, NAD, Non-toxic, A & O x 3, normal weight, looks well HEENT: Atraumatic, Normocephalic. Neck supple. No masses, No LAD. Ears and Nose: No external deformity. CV: RRR, No M/G/R. No JVD. No thrill. No extra heart sounds. PULM: CTA B, no wheezes, crackles, rhonchi. No retractions. No resp. distress. No accessory muscle use. EXTR: No c/c/e  NEURO Normal gait.  PSYCH: Normally interactive. Conversant. Not depressed or anxious appearing.  Calm demeanor.    Assessment and Plan: Essential hypertension - Plan: diltiazem (DILACOR XR) 120 MG 24 hr capsule  Screening examination for infectious disease - Plan: Hepatitis C antibody  Will try a different type of CCB as she has had sx with several BP medications at this time.  Start on a low dose of 24 hour diltiazeam.  Offered to keep her off BP medication entirely and check BP for a while, but she is afraid that she will get too high She will keep me posted as to her BP readings.  She would like to have hep C screening   Signed Lamar Blinks, MD

## 2015-07-11 ENCOUNTER — Telehealth: Payer: Self-pay | Admitting: Family Medicine

## 2015-07-11 NOTE — Telephone Encounter (Signed)
lmom of patient new appt time on 08/15/15 @10 :30

## 2015-07-13 ENCOUNTER — Encounter: Payer: Self-pay | Admitting: Family Medicine

## 2015-07-15 ENCOUNTER — Encounter: Payer: Self-pay | Admitting: Family Medicine

## 2015-08-07 ENCOUNTER — Encounter: Payer: Self-pay | Admitting: Family Medicine

## 2015-08-08 ENCOUNTER — Other Ambulatory Visit: Payer: Self-pay | Admitting: Family Medicine

## 2015-08-08 DIAGNOSIS — I10 Essential (primary) hypertension: Secondary | ICD-10-CM

## 2015-08-08 MED ORDER — AMLODIPINE BESYLATE 5 MG PO TABS: 5.0000 mg | ORAL_TABLET | Freq: Every day | ORAL | Status: DC

## 2015-08-15 ENCOUNTER — Ambulatory Visit: Payer: 59 | Admitting: Family Medicine

## 2015-08-15 ENCOUNTER — Encounter: Payer: Self-pay | Admitting: Family Medicine

## 2015-08-15 ENCOUNTER — Ambulatory Visit (INDEPENDENT_AMBULATORY_CARE_PROVIDER_SITE_OTHER): Payer: 59 | Admitting: Family Medicine

## 2015-08-15 VITALS — BP 138/76 | HR 93 | Temp 98.1°F | Resp 16 | Ht 64.0 in | Wt 135.0 lb

## 2015-08-15 DIAGNOSIS — Z1159 Encounter for screening for other viral diseases: Secondary | ICD-10-CM | POA: Diagnosis not present

## 2015-08-15 DIAGNOSIS — I10 Essential (primary) hypertension: Secondary | ICD-10-CM | POA: Diagnosis not present

## 2015-08-15 DIAGNOSIS — E7800 Pure hypercholesterolemia, unspecified: Secondary | ICD-10-CM

## 2015-08-15 LAB — LIPID PANEL
Cholesterol: 254 mg/dL — ABNORMAL HIGH (ref 125–200)
HDL: 73 mg/dL (ref >=46)
LDL Cholesterol: 166 mg/dL — ABNORMAL HIGH (ref <130)
Total CHOL/HDL Ratio: 3.5 Ratio (ref <=5.0)
Triglycerides: 75 mg/dL (ref <150)
VLDL: 15 mg/dL (ref <30)

## 2015-08-15 LAB — HEPATITIS C ANTIBODY: HCV AB: NEGATIVE

## 2015-08-15 NOTE — Patient Instructions (Signed)
Try pepcid AC or zantac for stomach upset. Generic is fine.

## 2015-08-15 NOTE — Progress Notes (Signed)
   Subjective:    Patient ID: Loura Back, female    DOB: 10-11-1960, 54 y.o.   MRN: [id number]  HPI This is a pleasant 54 yo female who presents today for follow up of HTN and hepatitis C screening. Patient has recently changed her position at work and is now working in [employer]. She works regular hours Monday through Friday and no weekends or holidays. They have a walking group at work and walk daily. She is sleeping better.  She is currently taking amlodipine 5 mg. She was having some leg cramps, but she started eating a banana daily and her cramps have resolved. She has not had any leg swelling.  She has been working to [employer] and is hoping her total and LDL cholesterol have improved.  She has occasional stomach upset and takes omeprazole with good relief.    Review of Systems Per HPI    Objective:   Physical Exam Physical Exam  Constitutional: Oriented to person, place, and time. She appears well-developed and well-nourished.  HENT:  Head: Normocephalic and atraumatic.  Eyes: Conjunctivae are normal.  Neck: Normal range of motion. Neck supple.  Cardiovascular: Normal rate, regular rhythm and normal heart sounds.   Pulmonary/Chest: Effort normal and breath sounds normal.  Musculoskeletal: Normal range of motion.  Neurological: Alert and oriented to person, place, and time.  Skin: Skin is warm and dry.  Psychiatric: Normal mood and affect. Behavior is normal. Judgment and thought content normal.  Vitals reviewed.  BP 138/76 mmHg  Pulse 93  Temp(Src) 98.1 F (36.7 C)  Resp 16  Ht 5\' 4"  (1.626 m)  Wt 135 lb (61.236 kg)  BMI 23.16 kg/m2 Wt Readings from Last 3 Encounters:  08/15/15 135 lb (61.236 kg)  07/01/15 139 lb (63.05 kg)  05/26/15 139 lb 9.6 oz (63.322 kg)      Assessment & Plan:  1. Essential hypertension - continue amlodipine 5 mg  2. Need for hepatitis C screening test - Hepatitis C antibody  3. Hypercholesterolemia -  Lipid panel  - follow up 6 months for CPE  Clarene Reamer, FNP-BC  [facility], [facility]  08/15/2015 8:44 AM

## 2016-01-23 DIAGNOSIS — R5383 Other fatigue: Secondary | ICD-10-CM | POA: Diagnosis not present

## 2016-01-23 DIAGNOSIS — Z79899 Other long term (current) drug therapy: Secondary | ICD-10-CM | POA: Diagnosis not present

## 2016-01-23 DIAGNOSIS — Z91128 Patient's intentional underdosing of medication regimen for other reason: Secondary | ICD-10-CM | POA: Diagnosis not present

## 2016-01-23 DIAGNOSIS — Z887 Allergy status to serum and vaccine status: Secondary | ICD-10-CM | POA: Diagnosis not present

## 2016-01-23 DIAGNOSIS — R079 Chest pain, unspecified: Secondary | ICD-10-CM | POA: Diagnosis not present

## 2016-01-23 DIAGNOSIS — T50906A Underdosing of unspecified drugs, medicaments and biological substances, initial encounter: Secondary | ICD-10-CM | POA: Diagnosis not present

## 2016-01-23 DIAGNOSIS — I10 Essential (primary) hypertension: Secondary | ICD-10-CM | POA: Diagnosis not present

## 2016-01-23 DIAGNOSIS — E785 Hyperlipidemia, unspecified: Secondary | ICD-10-CM | POA: Diagnosis not present

## 2016-01-24 DIAGNOSIS — Z887 Allergy status to serum and vaccine status: Secondary | ICD-10-CM | POA: Diagnosis not present

## 2016-01-24 DIAGNOSIS — I10 Essential (primary) hypertension: Secondary | ICD-10-CM | POA: Diagnosis not present

## 2016-01-24 DIAGNOSIS — E785 Hyperlipidemia, unspecified: Secondary | ICD-10-CM | POA: Diagnosis not present

## 2016-01-24 DIAGNOSIS — Z79899 Other long term (current) drug therapy: Secondary | ICD-10-CM | POA: Diagnosis not present

## 2016-01-24 DIAGNOSIS — R5383 Other fatigue: Secondary | ICD-10-CM | POA: Diagnosis not present

## 2016-01-24 DIAGNOSIS — T50906A Underdosing of unspecified drugs, medicaments and biological substances, initial encounter: Secondary | ICD-10-CM | POA: Diagnosis not present

## 2016-01-24 DIAGNOSIS — R079 Chest pain, unspecified: Secondary | ICD-10-CM | POA: Diagnosis not present

## 2016-01-24 DIAGNOSIS — Z91128 Patient's intentional underdosing of medication regimen for other reason: Secondary | ICD-10-CM | POA: Diagnosis not present

## 2016-01-24 DIAGNOSIS — I081 Rheumatic disorders of both mitral and tricuspid valves: Secondary | ICD-10-CM | POA: Diagnosis not present

## 2016-01-31 ENCOUNTER — Ambulatory Visit (INDEPENDENT_AMBULATORY_CARE_PROVIDER_SITE_OTHER): Payer: 59 | Admitting: Family Medicine

## 2016-01-31 ENCOUNTER — Encounter: Payer: Self-pay | Admitting: Family Medicine

## 2016-01-31 DIAGNOSIS — I10 Essential (primary) hypertension: Secondary | ICD-10-CM

## 2016-01-31 DIAGNOSIS — E785 Hyperlipidemia, unspecified: Secondary | ICD-10-CM

## 2016-01-31 DIAGNOSIS — Z0000 Encounter for general adult medical examination without abnormal findings: Secondary | ICD-10-CM

## 2016-01-31 LAB — LIPID PANEL
Cholesterol: 265 mg/dL — ABNORMAL HIGH (ref 125–200)
HDL: 77 mg/dL (ref >=46)
LDL CALC: 165 mg/dL — AB (ref <130)
Total CHOL/HDL Ratio: 3.4 Ratio (ref <=5.0)
Triglycerides: 114 mg/dL (ref <150)
VLDL: 23 mg/dL (ref <30)

## 2016-01-31 LAB — COMPLETE METABOLIC PANEL WITH GFR
ALBUMIN: 4.4 g/dL (ref 3.6–5.1)
ALT: 20 U/L (ref 6–29)
AST: 19 U/L (ref 10–35)
Alkaline Phosphatase: 99 U/L (ref 33–130)
BILIRUBIN TOTAL: 0.6 mg/dL (ref 0.2–1.2)
BUN: 18 mg/dL (ref 7–25)
CO2: 26 mmol/L (ref 20–31)
CREATININE: 0.91 mg/dL (ref 0.50–1.05)
Calcium: 9.7 mg/dL (ref 8.6–10.4)
Chloride: 103 mmol/L (ref 98–110)
GFR, Est African American: 81 mL/min (ref >=60)
GFR, Est Non African American: 70 mL/min (ref >=60)
GLUCOSE: 90 mg/dL (ref 65–99)
Potassium: 3.9 mmol/L (ref 3.5–5.3)
SODIUM: 139 mmol/L (ref 135–146)
Total Protein: 7.2 g/dL (ref 6.1–8.1)

## 2016-01-31 LAB — TSH: TSH: 0.58 m[IU]/L

## 2016-01-31 NOTE — Patient Instructions (Addendum)
Keep up the good work with your exercise! Please try to take your amlodipine at night for 1 week and let me know if you tolerate it better. If not, we will change it.   Keeping You Healthy  Get These Tests  Blood Pressure- Have your blood pressure checked by your healthcare provider at least once a year.  Normal blood pressure is 120/80.  Weight- Have your body mass index (BMI) calculated to screen for obesity.  BMI is a measure of body fat based on height and weight.  You can calculate your own BMI at [employer]velB[employer].it  Cholesterol- Have your cholesterol checked every year.  Diabetes- Have your blood sugar checked every year if you have high blood pressure, high cholesterol, a family history of diabetes or if you are overweight.  Pap Test - Have a pap test every 1 to 5 years if you have been sexually active.  If you are older than 65 and recent pap tests have been normal you may not need additional pap tests.  In addition, if you have had a hysterectomy  for benign disease additional pap tests are not necessary.  Mammogram-Yearly mammograms are essential for early detection of breast cancer  Screening for Colon Cancer- Colonoscopy starting at age 33. Screening may begin sooner depending on your family history and other health conditions.  Follow up colonoscopy as directed by your Gastroenterologist.  Screening for Osteoporosis- Screening begins at age 71 with bone density scanning, sooner if you are at higher risk for developing Osteoporosis.  Get these medicines  Calcium with Vitamin D- Your body requires 1200-1500 mg of Calcium a day and [phone] IU of Vitamin D a day.  You can only absorb 500 mg of Calcium at a time therefore Calcium must be taken in 2 or 3 separate doses throughout the day.  Hormones- Hormone therapy has been associated with increased risk for certain cancers and heart disease.  Talk to your healthcare provider about if you need relief from menopausal  symptoms.  Aspirin- Ask your healthcare provider about taking Aspirin to prevent Heart Disease and Stroke.  Get these Immuniztions  Flu shot- Every fall  Pneumonia shot- Once after the age of 52; if you are younger ask your healthcare provider if you need a pneumonia shot.  Tetanus- Every ten years.  Zostavax- Once after the age of 56 to prevent shingles.  Take these steps  Don't smoke- Your healthcare provider can help you quit. For tips on how to quit, ask your healthcare provider or go to www.smokefree.gov or call 1-800 QUIT-NOW.  Be physically active- Exercise 5 days a week for a minimum of 30 minutes.  If you are not already physically active, start slow and gradually work up to 30 minutes of moderate physical activity.  Try walking, dancing, bike riding, swimming, etc.  Eat a healthy diet- Eat a variety of healthy foods such as fruits, vegetables, whole grains, low fat milk, low fat cheeses, yogurt, lean meats, chicken, fish, eggs, dried beans, tofu, etc.  For more information go to www.thenutritionsource.org  Dental visit- Brush and floss teeth twice daily; visit your dentist twice a year.  Eye exam- Visit your Optometrist or Ophthalmologist yearly.  Drink alcohol in moderation- Limit alcohol intake to one drink or less a day.  Never drink and drive.  Depression- Your emotional health is as important as your physical health.  If you're feeling down or losing interest in things you normally enjoy, please talk to your healthcare provider.  Seat  Belts- can save your life; always wear one  Smoke/Carbon Monoxide detectors- These detectors need to be installed on the appropriate level of your home.  Replace batteries at least once a year.  Violence- If anyone is threatening or hurting you, please tell your healthcare provider.  Living Will/ Health care power of attorney- Discuss with your healthcare provider and family.    IF you received an x-ray today, you will receive an  invoice from [facility] Radiology. Please contact [facility]iology at [phone] with questions or concerns regarding your invoice.   IF you received labwork today, you will receive an invoice from [employer]. Please contact [facility] at [phone] with questions or concerns regarding your invoice.   Our billing staff will not be able to assist you with questions regarding bills from these companies.  You will be contacted with the lab results as soon as they are available. The fastest way to get your results is to activate your My Chart account. Instructions are located on the last page of this paperwork. If you have not heard from [place] regarding the results in 2 weeks, please contact this office.

## 2016-01-31 NOTE — Progress Notes (Signed)
Subjective:    Patient ID: Haley Vega, female    DOB: 12-25-1960, 55 y.o.   MRN: [id number]  HPI This is a pleasant 55 yo female who presents today for CPE. Was seen last week in ED with chest pain and swelling from a bug bite. Had a negative stress test. Has been doing well. Work is going well. She and coworkers walk daily.   Last CPE- 02/14/2015 Mammo- 03/09/2015 Pap- 10/2013 Colonoscopy- 12/15 Tdap-10/01/2014 Flu- [facility]- annual  Dental-annual Exercise- walks daily  Has been having stomach upset when taking amlodipine. Is interested in changing to a different antihiypertinesive.   Review of Systems  Constitutional: Negative.   HENT: Negative.   Eyes: Negative.   Respiratory: Negative.  Chest tightness: last week, resolved now.   Cardiovascular: Negative.   Gastrointestinal: Positive for abdominal pain (episgastric, after taking amlodipine in the morning).  Endocrine: Negative.   Genitourinary: Negative.   Musculoskeletal: Negative.   Skin: Negative.   Allergic/Immunologic: Negative.   Neurological: Negative.   Hematological: Negative.   Psychiatric/Behavioral: Negative.        Objective:   Physical Exam Physical Exam  Constitutional: She is oriented to person, place, and time. She appears well-developed and well-nourished. No distress.  HENT:  Head: Normocephalic and atraumatic.  Right Ear: External ear normal.  Left Ear: External ear normal.  Nose: Nose normal.  Mouth/Throat: Oropharynx is clear and moist. No oropharyngeal exudate.  Eyes: Conjunctivae are normal. Pupils are equal, round, and reactive to light.  Neck: Normal range of motion. Neck supple. No JVD present. No thyromegaly present.  Cardiovascular: Normal rate, regular rhythm, normal heart sounds and intact distal pulses.   Pulmonary/Chest: Effort normal and breath sounds normal. Right breast exhibits no inverted nipple, no mass, no nipple discharge, no skin change and no tenderness. Left breast  exhibits no inverted nipple, no mass, no nipple discharge, no skin change and no tenderness. Breasts are symmetrical.  Abdominal: Soft. Bowel sounds are normal. She exhibits no distension and no mass. There is no tenderness. There is no rebound and no guarding.  Genitourinary: Vagina normal. Pelvic exam was performed with patient supine. There is no rash, tenderness, lesion or injury on the right labia. There is no rash, tenderness, lesion or injury on the left labia. Cervix exhibits no motion tenderness and no discharge. No vaginal discharge found.  Musculoskeletal: Normal range of motion. She exhibits no edema or tenderness.  Lymphadenopathy:    She has no cervical adenopathy.  Neurological: She is alert and oriented to person, place, and time. She has normal reflexes.  Skin: Skin is warm and dry. She is not diaphoretic.  Psychiatric: She has a normal mood and affect. Her behavior is normal. Judgment and thought content normal.  Vitals reviewed.    BP 132/82 mmHg  Pulse 79  Temp(Src) 98.5 F (36.9 C) (Oral)  Resp 16  Ht 5\' 4"  (1.626 m)  Wt 136 lb 9.6 oz (61.961 kg)  BMI 23.44 kg/m2  SpO2 97% Wt Readings from Last 3 Encounters:  01/31/16 136 lb 9.6 oz (61.961 kg)  08/15/15 135 lb (61.236 kg)  07/01/15 139 lb (63.05 kg)   Depression screen [facility] 2/9 01/31/2016 08/15/2015 07/01/2015 05/26/2015 02/14/2015  Decreased Interest 0 0 0 0 0  Down, Depressed, Hopeless 0 0 0 0 0  PHQ - 2 Score 0 0 0 0 0         Assessment & Plan:  1. Annual physical exam - Discussed and encouraged healthy  lifestyle choices- adequate sleep, regular exercise, stress management and healthy food choices.   2. Essential hypertension - well controlled on amlodipine 5 mg. Seems to be upsetting her stomach. She will try to take it at bedtime and if it continues to bother her, will change.  - TSH - COMPLETE METABOLIC PANEL WITH GFR  3. HLD (hyperlipidemia) - TSH - COMPLETE METABOLIC PANEL WITH GFR - Lipid  panel  - follow up in 6 months  Clarene Reamer, FNP-BC  [facility]  02/06/2016 9:46 PM

## 2016-02-14 ENCOUNTER — Encounter: Payer: 59 | Admitting: Family Medicine

## 2016-04-13 ENCOUNTER — Other Ambulatory Visit: Payer: Self-pay | Admitting: Family Medicine

## 2016-04-13 DIAGNOSIS — Z1231 Encounter for screening mammogram for malignant neoplasm of breast: Secondary | ICD-10-CM

## 2016-04-16 DIAGNOSIS — R112 Nausea with vomiting, unspecified: Secondary | ICD-10-CM | POA: Diagnosis not present

## 2016-04-16 DIAGNOSIS — R42 Dizziness and giddiness: Secondary | ICD-10-CM | POA: Diagnosis not present

## 2016-04-16 DIAGNOSIS — I10 Essential (primary) hypertension: Secondary | ICD-10-CM | POA: Diagnosis not present

## 2016-04-16 DIAGNOSIS — R110 Nausea: Secondary | ICD-10-CM | POA: Diagnosis not present

## 2016-04-24 ENCOUNTER — Ambulatory Visit (INDEPENDENT_AMBULATORY_CARE_PROVIDER_SITE_OTHER): Payer: 59 | Admitting: Physician Assistant

## 2016-04-24 VITALS — Temp 98.2°F

## 2016-04-24 DIAGNOSIS — R42 Dizziness and giddiness: Secondary | ICD-10-CM

## 2016-04-24 DIAGNOSIS — I10 Essential (primary) hypertension: Secondary | ICD-10-CM | POA: Diagnosis not present

## 2016-04-24 DIAGNOSIS — E785 Hyperlipidemia, unspecified: Secondary | ICD-10-CM | POA: Diagnosis not present

## 2016-04-24 LAB — POCT CBC
GRANULOCYTE PERCENT: 60.9 % (ref 37–80)
HEMATOCRIT: 37.3 % — AB (ref 37.7–47.9)
Hemoglobin: 13.5 g/dL (ref 12.2–16.2)
Lymph, poc: 2.2 (ref 0.6–3.4)
MCH: 31.2 pg (ref 27–31.2)
MCHC: 36.3 g/dL — AB (ref 31.8–35.4)
MCV: 85.9 fL (ref 80–97)
MID (cbc): 0.3 (ref 0–0.9)
MPV: 6.8 fL (ref 0–99.8)
POC Granulocyte: 3.9 (ref 2–6.9)
POC LYMPH PERCENT: 34.4 %L (ref 10–50)
POC MID %: 4.7 % (ref 0–12)
Platelet Count, POC: 319 10*3/uL (ref 142–424)
RBC: 4.35 M/uL (ref 4.04–5.48)
RDW, POC: 13.1 %
WBC: 6.4 10*3/uL (ref 4.6–10.2)

## 2016-04-24 LAB — POCT GLYCOSYLATED HEMOGLOBIN (HGB A1C): Hemoglobin A1C: 5.4

## 2016-04-24 LAB — COMPLETE METABOLIC PANEL WITH GFR
ALBUMIN: 4.4 g/dL (ref 3.6–5.1)
ALK PHOS: 84 U/L (ref 33–130)
ALT: 17 U/L (ref 6–29)
AST: 15 U/L (ref 10–35)
BILIRUBIN TOTAL: 0.4 mg/dL (ref 0.2–1.2)
BUN: 21 mg/dL (ref 7–25)
CALCIUM: 9.8 mg/dL (ref 8.6–10.4)
CO2: 23 mmol/L (ref 20–31)
CREATININE: 0.93 mg/dL (ref 0.50–1.05)
Chloride: 104 mmol/L (ref 98–110)
GFR, Est African American: 79 mL/min (ref >=60)
GFR, Est Non African American: 68 mL/min (ref >=60)
Glucose, Bld: 93 mg/dL (ref 65–99)
Potassium: 4.4 mmol/L (ref 3.5–5.3)
Sodium: 139 mmol/L (ref 135–146)
Total Protein: 7 g/dL (ref 6.1–8.1)

## 2016-04-24 LAB — POCT URINALYSIS DIP (MANUAL ENTRY)
BILIRUBIN UA: NEGATIVE
GLUCOSE UA: NEGATIVE
Ketones, POC UA: NEGATIVE
Leukocytes, UA: NEGATIVE
NITRITE UA: NEGATIVE
Protein Ur, POC: NEGATIVE
RBC UA: NEGATIVE
Spec Grav, UA: 1.01
Urobilinogen, UA: 0.2
pH, UA: 6.5

## 2016-04-24 LAB — GLUCOSE, POCT (MANUAL RESULT ENTRY): POC GLUCOSE: 89 mg/dL (ref 70–99)

## 2016-04-24 MED ORDER — ONDANSETRON HCL 4 MG PO TABS: 4.0000 mg | ORAL_TABLET | Freq: Three times a day (TID) | ORAL | 0 refills | Status: DC | PRN

## 2016-04-24 MED ORDER — MECLIZINE HCL 25 MG PO TABS: 25.0000 mg | ORAL_TABLET | Freq: Three times a day (TID) | ORAL | 3 refills | Status: DC | PRN

## 2016-04-24 MED ORDER — ONDANSETRON HCL 4 MG PO TABS
4.0000 mg | ORAL_TABLET | Freq: Once | ORAL | Status: AC
  Administered 2016-04-24: 4 mg via ORAL

## 2016-04-24 MED ORDER — LOSARTAN POTASSIUM-HCTZ 50-12.5 MG PO TABS: 0.5000 | ORAL_TABLET | Freq: Every day | ORAL | 0 refills | Status: DC

## 2016-04-24 MED FILL — MECLIZINE 25 MG TABLET: 25 | 10 days supply | Qty: 30 | Fill #0

## 2016-04-24 MED FILL — LOSARTAN-HCTZ 50-12.5 MG TA: 50-12.5 | 90 days supply | Qty: 45 | Fill #0

## 2016-04-24 MED FILL — ONDANSETRON HCL 4 MG TABLET: 4 | 6 days supply | Qty: 20 | Fill #0

## 2016-04-24 NOTE — Patient Instructions (Addendum)
  Please drink lots of water daily.  Try meclizine for dizziness.  Please attend your cardiology appointment as we are scheduling this for you.    IF you received an x-ray today, you will receive an invoice from [facility]. Please contact [facility] at [phone] with questions or concerns regarding your invoice.   IF you received labwork today, you will receive an invoice from [employer]. Please contact [facility] at [phone] with questions or concerns regarding your invoice.   Our billing staff will not be able to assist you with questions regarding bills from these companies.  You will be contacted with the lab results as soon as they are available. The fastest way to get your results is to activate your My Chart account. Instructions are located on the last page of this paperwork. If you have not heard from [place] regarding the results in 2 weeks, please contact this office.

## 2016-04-24 NOTE — Progress Notes (Signed)
04/25/2016 9:36 AM   DOB: 1960-11-20 / MRN: [id number]  SUBJECTIVE:  Haley Vega is a 55 y.o. female presenting for two episodes of dizziness.  The first episode was so bad she presented to the ED and this was deemed a non emergency and she was discharged. The second episode was today, started roughly 4 hours ago after a walk and she states she felt the room spinning and felt as if she may faint.  She denies chest pain, palpitations, with the dizziness, but she does endorse some resolved SOB that comes with the dizziness only. She has a history of HTN and she does not take her BP medication because it causes her legs to cramp.  She was being seen by Ms. Carlean Purl FNP at [facility]. She denies a family history of CAD.  She has a history of hypercholesteremia.  She denies a history of diabetes and smoking.    She is allergic to influenza vaccines.   She  has a past medical history of Hypercholesteremia and Hypertension.    She  reports that she has never smoked. She does not have any smokeless tobacco history on file. She reports that she drinks alcohol. She reports that she does not use drugs. She  reports that she does not engage in sexual activity. The patient  has a past surgical history that includes Tubal ligation and Tonsillectomy.  Her family history is not on file.  Review of Systems  Constitutional: Negative for chills and fever.  Respiratory: Positive for shortness of breath (with the dizziness only).   Cardiovascular: Negative for chest pain.  Gastrointestinal: Negative for abdominal pain.  Genitourinary: Negative for dysuria.  Musculoskeletal: Negative for myalgias.  Skin: Negative for itching and rash.  Neurological: Positive for dizziness. Negative for headaches.    Problem list and medications reviewed and updated by myself where necessary, and exist elsewhere in the encounter.   OBJECTIVE:   Physical Exam  Constitutional: She is oriented to person, place, and time. She appears  well-nourished. No distress.  Eyes: EOM are normal. Pupils are equal, round, and reactive to light.  Cardiovascular: Normal rate, regular rhythm, normal heart sounds and intact distal pulses.   Pulmonary/Chest: Effort normal and breath sounds normal. No respiratory distress. She has no wheezes. She has no rales. She exhibits no tenderness.  Abdominal: Soft. Bowel sounds are normal. She exhibits no distension.  Musculoskeletal: Normal range of motion.  Neurological: She is alert and oriented to person, place, and time. She displays no atrophy and no tremor. No cranial nerve deficit or sensory deficit. She exhibits normal muscle tone. She displays a negative Romberg sign. She displays no seizure activity. Coordination and gait normal. GCS eye subscore is 4. GCS verbal subscore is 5. GCS motor subscore is 6.  Reflex Scores:      Tricep reflexes are 2+ on the right side and 2+ on the left side.      Bicep reflexes are 2+ on the right side and 2+ on the left side.      Brachioradialis reflexes are 2+ on the right side and 2+ on the left side.      Patellar reflexes are 2+ on the right side and 2+ on the left side.      Achilles reflexes are 2+ on the right side and 2+ on the left side. Heel & Toe, RAM, FTN, Heel to Shin intact to challenge.   Skin: Skin is warm and dry. She is not diaphoretic.  Psychiatric: She  has a normal mood and affect.  Vitals reviewed.   Results for orders placed or performed in visit on 04/24/16 (from the past 72 hour(s))  COMPLETE METABOLIC PANEL WITH GFR     Status: None   Collection Time: 04/24/16  3:53 PM  Result Value Ref Range   Sodium 139 135 - 146 mmol/L   Potassium 4.4 3.5 - 5.3 mmol/L   Chloride 104 98 - 110 mmol/L   CO2 23 20 - 31 mmol/L   Glucose, Bld 93 65 - 99 mg/dL   BUN 21 7 - 25 mg/dL   Creat 0.93 0.50 - 1.05 mg/dL    Comment:   For patients > or = 57 years of age: The upper reference limit for Creatinine is approximately 13% higher for people  identified as African-American.      Total Bilirubin 0.4 0.2 - 1.2 mg/dL   Alkaline Phosphatase 84 33 - 130 U/L   AST 15 10 - 35 U/L   ALT 17 6 - 29 U/L   Total Protein 7.0 6.1 - 8.1 g/dL   Albumin 4.4 3.6 - 5.1 g/dL   Calcium 9.8 8.6 - 10.4 mg/dL   GFR, Est African American 79 >=60 mL/min   GFR, Est Non African American 68 >=60 mL/min  POCT CBC     Status: Abnormal   Collection Time: 04/24/16  4:10 PM  Result Value Ref Range   WBC 6.4 4.6 - 10.2 K/uL   Lymph, poc 2.2 0.6 - 3.4   POC LYMPH PERCENT 34.4 10 - 50 %L   MID (cbc) 0.3 0 - 0.9   POC MID % 4.7 0 - 12 %M   POC Granulocyte 3.9 2 - 6.9   Granulocyte percent 60.9 37 - 80 %G   RBC 4.35 4.04 - 5.48 M/uL   Hemoglobin 13.5 12.2 - 16.2 g/dL   HCT, POC 37.3 (A) 37.7 - 47.9 %   MCV 85.9 80 - 97 fL   MCH, POC 31.2 27 - 31.2 pg   MCHC 36.3 (A) 31.8 - 35.4 g/dL   RDW, POC 13.1 %   Platelet Count, POC 319 142 - 424 K/uL   MPV 6.8 0 - 99.8 fL  POCT urinalysis dipstick     Status: None   Collection Time: 04/24/16  4:10 PM  Result Value Ref Range   Color, UA yellow yellow   Clarity, UA clear clear   Glucose, UA negative negative   Bilirubin, UA negative negative   Ketones, POC UA negative negative   Spec Grav, UA 1.010    Blood, UA negative negative   pH, UA 6.5    Protein Ur, POC negative negative   Urobilinogen, UA 0.2    Nitrite, UA Negative Negative   Leukocytes, UA Negative Negative  POCT glucose (manual entry)     Status: None   Collection Time: 04/24/16  4:10 PM  Result Value Ref Range   POC Glucose 89 70 - 99 mg/dl  POCT glycosylated hemoglobin (Hb A1C)     Status: None   Collection Time: 04/24/16  4:10 PM  Result Value Ref Range   Hemoglobin A1C 5.4     No results found.  Lab Results  Component Value Date   WBC 6.4 04/24/2016   HGB 13.5 04/24/2016   HCT 37.3 (A) 04/24/2016   MCV 85.9 04/24/2016   PLT 360 02/14/2015   Lab Results  Component Value Date   ALT 17 04/24/2016   AST 15 04/24/2016  ALKPHOS 84 04/24/2016   BILITOT 0.4 04/24/2016   Lab Results  Component Value Date   NA 139 04/24/2016   K 4.4 04/24/2016   CL 104 04/24/2016   CO2 23 04/24/2016   Lab Results  Component Value Date   HGBA1C 5.4 04/24/2016   Lab Results  Component Value Date   CREATININE 0.93 04/24/2016   Lab Results  Component Value Date   TSH 0.58 01/31/2016   Lab Results  Component Value Date   CHOL 265 (H) 01/31/2016   HDL 77 01/31/2016   LDLCALC 165 (H) 01/31/2016   TRIG 114 01/31/2016   CHOLHDL 3.4 01/31/2016      ASSESSMENT AND PLAN  Aaleah was seen today for dizziness.  Diagnoses and all orders for this visit:  Dizziness: She has features of BPPV.  However it concerns me that she feels SOB with the dizziness and only with the dizziness.  She may be suffering from a dysrhythmia.  Her EKG is normal.  She has some lipid abnormalities and has poor compliance with her hypertensive regimen. I will change her to Losartan-HCTZ at the lowest dose, treat her for BPPV, and get a cards consult. I have advised that she not miss doses of BP medication.  -     EKG 12-Lead -     POCT CBC -     POCT urinalysis dipstick -     COMPLETE METABOLIC PANEL WITH GFR -     Orthostatic vital signs -     POCT glucose (manual entry) -     POCT glycosylated hemoglobin (Hb A1C) -     Ambulatory referral to Cardiology -     meclizine (ANTIVERT) 25 MG tablet; Take 1 tablet (25 mg total) by mouth 3 (three) times daily as needed for dizziness.  Dyslipidemia -     Ambulatory referral to Cardiology  Essential hypertension: She has poor compliance with medication. Will stop the norvasc and try a low dose ARB thiazide.  -     Ambulatory referral to Cardiology    The patient was advised to call or return to clinic if she does not see an improvement in symptoms, or to seek the care of the closest emergency department if she worsens with the above plan.   Philis Fendt, MHS, PA-C [facility] 04/25/2016 9:36 AM

## 2016-04-25 ENCOUNTER — Encounter: Payer: Self-pay | Admitting: Emergency Medicine

## 2016-04-27 ENCOUNTER — Ambulatory Visit
Admission: RE | Admit: 2016-04-27 | Discharge: 2016-04-27 | Disposition: A | Discharge status: Home / Self Care | Payer: 59 | Source: Ambulatory Visit | Attending: Family Medicine | Admitting: Family Medicine

## 2016-04-27 DIAGNOSIS — Z1231 Encounter for screening mammogram for malignant neoplasm of breast: Secondary | ICD-10-CM | POA: Diagnosis not present

## 2016-05-02 ENCOUNTER — Ambulatory Visit (INDEPENDENT_AMBULATORY_CARE_PROVIDER_SITE_OTHER): Payer: 59 | Admitting: Physician Assistant

## 2016-05-02 ENCOUNTER — Encounter: Payer: Self-pay | Admitting: Physician Assistant

## 2016-05-02 VITALS — BP 118/80 | HR 83 | Temp 98.4°F | Resp 16 | Ht 64.0 in | Wt 138.0 lb

## 2016-05-02 DIAGNOSIS — R42 Dizziness and giddiness: Secondary | ICD-10-CM | POA: Diagnosis not present

## 2016-05-02 NOTE — Progress Notes (Signed)
   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  She has been dealing with vertigo that she has determined to be related to cold air flow hitting her head.  She has started to wear a scarf over her head to help with her symptoms.  She was given antivert and it helps but she is unable to take it because it makes her to sleepy.  Review of Systems  Neurological: Positive for dizziness.    Patient Active Problem List   Diagnosis Date Noted  . HLD (hyperlipidemia) 01/03/2015  . Essential hypertension 01/03/2015    Current Outpatient Prescriptions on File Prior to Visit  Medication Sig Dispense Refill  . aspirin EC 81 MG tablet Take 81 mg by mouth once.    Marland Kitchen losartan-hydrochlorothiazide (HYZAAR) 50-12.5 MG tablet Take 0.5 tablets by mouth daily. Do not miss doses. Please keep a blood pressure log. 45 tablet 0  . meclizine (ANTIVERT) 25 MG tablet Take 1 tablet (25 mg total) by mouth 3 (three) times daily as needed for dizziness. 30 tablet 3  . ondansetron (ZOFRAN) 4 MG tablet Take 1 tablet (4 mg total) by mouth every 8 (eight) hours as needed for nausea or vomiting. 20 tablet 0   No current facility-administered medications on file prior to visit.     Allergies  Allergen Reactions  . Influenza Vaccines Swelling    Had significant swelling of face, lips and eyelids    Pt patients social history was reviewed and updated.  Objective:  BP 118/80 (BP Location: Right Arm, Patient Position: Sitting, Cuff Size: Normal)   Pulse 83   Temp 98.4 F (36.9 C) (Oral)   Resp 16   Ht 5\' 4"  (1.626 m)   Wt 138 lb (62.6 kg)   SpO2 100%   BMI 23.69 kg/m   Physical Exam  Constitutional: She is oriented to person, place, and time and well-developed, well-nourished, and in no distress.  HENT:  Head: Normocephalic and atraumatic.  Right Ear: Hearing and external ear normal.  Left Ear:  Hearing and external ear normal.  Eyes: Conjunctivae are normal.  Neck: Normal range of motion.  Pulmonary/Chest: Effort normal.  Neurological: She is alert and oriented to person, place, and time. Gait normal.  Skin: Skin is warm and dry.  Psychiatric: Mood, memory, affect and judgment normal.  Vitals reviewed.   Assessment and Plan :  Dizziness  Pt has done research and has found air flow causes this symptom - a note was written for her job to have the air diverted because they have to go into the air system and not just place an air cover.  If this does not solve her problem she will RTC for further evaluation.  Windell Hummingbird PA-C  [facility] 05/02/2016 9:26 AM

## 2016-05-02 NOTE — Patient Instructions (Signed)
     IF you received an x-ray today, you will receive an invoice from [facility]. Please contact [facility] at [phone] with questions or concerns regarding your invoice.   IF you received labwork today, you will receive an invoice from [facility]. Please contact [facility] at [phone] with questions or concerns regarding your invoice.   Our billing staff will not be able to assist you with questions regarding bills from these companies.  You will be contacted with the lab results as soon as they are available. The fastest way to get your results is to activate your My Chart account. Instructions are located on the last page of this paperwork. If you have not heard from us regarding the results in 2 weeks, please contact this office.      

## 2016-05-14 DIAGNOSIS — E7800 Pure hypercholesterolemia, unspecified: Secondary | ICD-10-CM | POA: Diagnosis not present

## 2016-05-14 DIAGNOSIS — I10 Essential (primary) hypertension: Secondary | ICD-10-CM | POA: Diagnosis not present

## 2016-05-14 DIAGNOSIS — R42 Dizziness and giddiness: Secondary | ICD-10-CM | POA: Diagnosis not present

## 2016-05-15 MED FILL — PRAVASTATIN NA 20 MG TAB: 20 | 30 days supply | Qty: 30 | Fill #0

## 2016-07-14 ENCOUNTER — Other Ambulatory Visit: Payer: Self-pay | Admitting: Physician Assistant

## 2016-07-17 MED ORDER — LOSARTAN POTASSIUM-HCTZ 50-12.5 MG PO TABS: 0.5000 | ORAL_TABLET | Freq: Every day | ORAL | 0 refills | Status: DC

## 2016-07-17 MED FILL — LOSARTAN-HCTZ 50-12.5 MG TA: 50-12.5 | 90 days supply | Qty: 45 | Fill #0

## 2016-07-18 DIAGNOSIS — R079 Chest pain, unspecified: Secondary | ICD-10-CM | POA: Diagnosis not present

## 2016-07-18 DIAGNOSIS — I10 Essential (primary) hypertension: Secondary | ICD-10-CM | POA: Diagnosis not present

## 2016-07-18 DIAGNOSIS — R0602 Shortness of breath: Secondary | ICD-10-CM | POA: Diagnosis not present

## 2016-07-18 DIAGNOSIS — R52 Pain, unspecified: Secondary | ICD-10-CM | POA: Diagnosis not present

## 2016-08-02 DIAGNOSIS — E7800 Pure hypercholesterolemia, unspecified: Secondary | ICD-10-CM | POA: Diagnosis not present

## 2016-08-02 DIAGNOSIS — I10 Essential (primary) hypertension: Secondary | ICD-10-CM | POA: Diagnosis not present

## 2016-10-11 ENCOUNTER — Ambulatory Visit (INDEPENDENT_AMBULATORY_CARE_PROVIDER_SITE_OTHER): Payer: 59 | Admitting: Physician Assistant

## 2016-10-11 VITALS — BP 116/76 | HR 86 | Temp 97.9°F | Ht 64.0 in | Wt 139.2 lb

## 2016-10-11 DIAGNOSIS — I10 Essential (primary) hypertension: Secondary | ICD-10-CM | POA: Diagnosis not present

## 2016-10-11 DIAGNOSIS — K130 Diseases of lips: Secondary | ICD-10-CM

## 2016-10-11 MED ORDER — LOSARTAN POTASSIUM-HCTZ 50-12.5 MG PO TABS: 0.5000 | ORAL_TABLET | Freq: Every day | ORAL | 0 refills | Status: DC

## 2016-10-11 MED ORDER — FLUCONAZOLE 150 MG PO TABS: 150.0000 mg | ORAL_TABLET | ORAL | 0 refills | Status: DC

## 2016-10-11 MED FILL — FLUCONAZOLE 150 MG TABLET: 150 | 28 days supply | Qty: 4 | Fill #0

## 2016-10-11 MED FILL — LOSARTAN-HCTZ 50-12.5 MG TA: 50-12.5 | 90 days supply | Qty: 45 | Fill #0

## 2016-10-11 NOTE — Patient Instructions (Signed)
     IF you received an x-ray today, you will receive an invoice from [facility]. Please contact [facility] at [phone] with questions or concerns regarding your invoice.   IF you received labwork today, you will receive an invoice from [facility]. Please contact [facility] at [phone] with questions or concerns regarding your invoice.   Our billing staff will not be able to assist you with questions regarding bills from these companies.  You will be contacted with the lab results as soon as they are available. The fastest way to get your results is to activate your My Chart account. Instructions are located on the last page of this paperwork. If you have not heard from us regarding the results in 2 weeks, please contact this office.     

## 2016-10-11 NOTE — Progress Notes (Signed)
10/11/2016 9:54 AM   DOB: [birth year] / MRN: [id number]  SUBJECTIVE:  Haley Vega is a 56 y.o. female presenting for BP medication refills.  Reports she has been compliant with therapy and feels the medication is working well.  Checks her BP daily and her log, which she has here today, show an average of roughly 125/85.    She complains of a rash about the corner of her right lips that has been present for about 4 months.  She has tried abx ointments, otc antivirals and reports nothing has helped.  Associates some itching about the area. Denies using any inhalers.  No history of diabetes.   She is allergic to influenza vaccines.   She  has a past medical history of Hypercholesteremia and Hypertension.    She  reports that she has never smoked. She has never used smokeless tobacco. She reports that she drinks alcohol. She reports that she does not use drugs. She  reports that she does not engage in sexual activity. The patient  has a past surgical history that includes Tubal ligation and Tonsillectomy.  Her family history is not on file.  Review of Systems  Constitutional: Negative for chills and fever.  Respiratory: Negative for cough and shortness of breath.   Cardiovascular: Negative for chest pain, orthopnea and leg swelling.  Skin: Positive for itching and rash.  Neurological: Negative for dizziness.    The problem list and medications were reviewed and updated by myself where necessary and exist elsewhere in the encounter.   OBJECTIVE:  BP 116/76 (BP Location: Right Arm, Patient Position: Sitting, Cuff Size: Small)   Pulse 86   Temp 97.9 F (36.6 C) (Oral)   Ht 5' 4"  (1.626 m)   Wt 139 lb 3.2 oz (63.1 kg)   SpO2 100%   BMI 23.89 kg/m   Physical Exam  Constitutional: She is oriented to person, place, and time. No distress.  HENT:  Head:    Cardiovascular: Normal rate, regular rhythm and normal heart sounds.  Exam reveals no gallop and no friction rub.   No murmur  heard. Pulmonary/Chest: Effort normal and breath sounds normal.  Musculoskeletal: Normal range of motion. She exhibits no edema.  Neurological: She is alert and oriented to person, place, and time.  Vitals reviewed.   Lab Results  Component Value Date   TSH 0.58 01/31/2016   Lab Results  Component Value Date   CREATININE 0.93 04/24/2016   BUN 21 04/24/2016   NA 139 04/24/2016   K 4.4 04/24/2016   CL 104 04/24/2016   CO2 23 04/24/2016   Lab Results  Component Value Date   WBC 6.4 04/24/2016   HGB 13.5 04/24/2016   HCT 37.3 (A) 04/24/2016   MCV 85.9 04/24/2016   PLT 360 02/14/2015   Lab Results  Component Value Date   CHOL 265 (H) 01/31/2016   HDL 77 01/31/2016   LDLCALC 165 (H) 01/31/2016   TRIG 114 01/31/2016   CHOLHDL 3.4 01/31/2016   Wt Readings from Last 3 Encounters:  10/11/16 139 lb 3.2 oz (63.1 kg)  05/02/16 138 lb (62.6 kg)  01/31/16 136 lb 9.6 oz (62 kg)     ASSESSMENT AND PLAN:  Haley Vega was seen today for medication refill.  Diagnoses and all orders for this visit:  Essential hypertension: Pressure well controlled.  She is followed by cardiology.I will check her lipids and a pap in one month.   -     CBC -  TSH -     CMP14+EGFR -     losartan-hydrochlorothiazide (HYZAAR) 50-12.5 MG tablet; Take 0.5 tablets by mouth daily. Do not miss doses. Check pressure one time per week.  Angular cheilitis: She has tried several creams and the rash has persisted.  Will try her on low dose fluconazole weekly.   -     fluconazole (DIFLUCAN) 150 MG tablet; Take 1 tablet (150 mg total) by mouth once a week. Repeat if needed    The patient is advised to call or return to clinic if she does not see an improvement in symptoms, or to seek the care of the closest emergency department if she worsens with the above plan.   Philis Fendt, MHS, PA-C [facility] 10/11/2016 9:54 AM

## 2016-10-12 ENCOUNTER — Encounter: Payer: Self-pay | Admitting: Physician Assistant

## 2016-10-12 LAB — CBC
HEMOGLOBIN: 12.9 g/dL (ref 11.1–15.9)
Hematocrit: 40.1 % (ref 34.0–46.6)
MCH: 30 pg (ref 26.6–33.0)
MCHC: 32.2 g/dL (ref 31.5–35.7)
MCV: 93 fL (ref 79–97)
PLATELETS: 372 10*3/uL (ref 150–379)
RBC: 4.3 x10E6/uL (ref 3.77–5.28)
RDW: 14.1 % (ref 12.3–15.4)
WBC: 5.9 10*3/uL (ref 3.4–10.8)

## 2016-10-12 LAB — CMP14+EGFR
ALK PHOS: 98 IU/L (ref 39–117)
ALT: 20 IU/L (ref 0–32)
AST: 22 IU/L (ref 0–40)
Albumin/Globulin Ratio: 1.7 (ref 1.2–2.2)
Albumin: 4.5 g/dL (ref 3.5–5.5)
BILIRUBIN TOTAL: 0.5 mg/dL (ref 0.0–1.2)
BUN/Creatinine Ratio: 26 — ABNORMAL HIGH (ref 9–23)
BUN: 23 mg/dL (ref 6–24)
CHLORIDE: 99 mmol/L (ref 96–106)
CO2: 26 mmol/L (ref 18–29)
Calcium: 9.8 mg/dL (ref 8.7–10.2)
Creatinine, Ser: 0.89 mg/dL (ref 0.57–1.00)
GFR calc non Af Amer: 72 mL/min/{1.73_m2} (ref >59)
GFR, EST AFRICAN AMERICAN: 83 mL/min/{1.73_m2} (ref >59)
GLUCOSE: 82 mg/dL (ref 65–99)
Globulin, Total: 2.6 g/dL (ref 1.5–4.5)
POTASSIUM: 4.4 mmol/L (ref 3.5–5.2)
Sodium: 142 mmol/L (ref 134–144)
TOTAL PROTEIN: 7.1 g/dL (ref 6.0–8.5)

## 2016-10-12 LAB — TSH: TSH: 0.469 u[IU]/mL (ref 0.450–4.500)

## 2016-10-12 NOTE — Progress Notes (Signed)
Labs look great.  Please send a letter.

## 2016-11-15 ENCOUNTER — Encounter: Payer: Self-pay | Admitting: Obstetrics & Gynecology

## 2016-11-15 ENCOUNTER — Ambulatory Visit (INDEPENDENT_AMBULATORY_CARE_PROVIDER_SITE_OTHER): Payer: 59 | Admitting: Obstetrics & Gynecology

## 2016-11-15 VITALS — BP 157/93 | HR 89

## 2016-11-15 DIAGNOSIS — Z0000 Encounter for general adult medical examination without abnormal findings: Secondary | ICD-10-CM

## 2016-11-15 DIAGNOSIS — Z01419 Encounter for gynecological examination (general) (routine) without abnormal findings: Secondary | ICD-10-CM
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
## 2016-11-16 LAB — CYTOLOGY - PAP
DIAGNOSIS: NEGATIVE
HPV (WINDOPATH): NOT DETECTED

## 2016-11-25 ENCOUNTER — Encounter: Payer: Self-pay | Admitting: Physician Assistant

## 2016-12-17 ENCOUNTER — Ambulatory Visit (INDEPENDENT_AMBULATORY_CARE_PROVIDER_SITE_OTHER): Payer: 59 | Admitting: Physician Assistant

## 2016-12-17 ENCOUNTER — Encounter: Payer: Self-pay | Admitting: Physician Assistant

## 2016-12-17 VITALS — BP 124/77 | HR 80 | Temp 98.2°F | Resp 16 | Ht 64.0 in | Wt 142.0 lb

## 2016-12-17 DIAGNOSIS — Z13220 Encounter for screening for lipoid disorders: Secondary | ICD-10-CM | POA: Diagnosis not present

## 2016-12-17 DIAGNOSIS — Z1329 Encounter for screening for other suspected endocrine disorder: Secondary | ICD-10-CM | POA: Diagnosis not present

## 2016-12-17 DIAGNOSIS — Z0000 Encounter for general adult medical examination without abnormal findings: Secondary | ICD-10-CM | POA: Diagnosis not present

## 2016-12-17 DIAGNOSIS — Z131 Encounter for screening for diabetes mellitus: Secondary | ICD-10-CM | POA: Diagnosis not present

## 2016-12-17 DIAGNOSIS — Z1389 Encounter for screening for other disorder: Secondary | ICD-10-CM

## 2016-12-17 DIAGNOSIS — I10 Essential (primary) hypertension: Secondary | ICD-10-CM | POA: Diagnosis not present

## 2016-12-17 DIAGNOSIS — Z130 Encounter for screening for diseases of the blood and blood-forming organs and certain disorders involving the immune mechanism: Secondary | ICD-10-CM | POA: Diagnosis not present

## 2016-12-17 MED ORDER — LOSARTAN POTASSIUM-HCTZ 50-12.5 MG PO TABS: 0.5000 | ORAL_TABLET | Freq: Every day | ORAL | 3 refills | Status: DC

## 2016-12-17 NOTE — Patient Instructions (Signed)
     IF you received an x-ray today, you will receive an invoice from [facility]. Please contact [facility] at [phone] with questions or concerns regarding your invoice.   IF you received labwork today, you will receive an invoice from [facility]. Please contact [facility] at [phone] with questions or concerns regarding your invoice.   Our billing staff will not be able to assist you with questions regarding bills from these companies.  You will be contacted with the lab results as soon as they are available. The fastest way to get your results is to activate your My Chart account. Instructions are located on the last page of this paperwork. If you have not heard from us regarding the results in 2 weeks, please contact this office.     

## 2016-12-17 NOTE — Progress Notes (Signed)
12/17/2016 9:13 AM   DOB: Sep 20, 1961 / MRN: [id number]  SUBJECTIVE:  Haley Vega is a 56 y.o. female presenting for annual exam.  She is seen by [facility] and is receiving regular care with documented negative PAP this year and a negative Mammo in 2017 of July with repeat planned in 7/18.  Walks 5-6 days a week at 20 minutes. She has documented allergy to the flu shot and this includes facial swelling.    Stopped smoking 25-30years ago and was a social smoker at less than a pack a week.   Denies dysthmic mood.  Feels safe at home.   She drinks maybe one beer a week.   She needs refills of BP medication today.  Tells me she feels well and is not having any side effects.   Depression screen PHQ 2/9 12/17/2016  Decreased Interest 0  Down, Depressed, Hopeless 0  PHQ - 2 Score 0    Immunization History  Administered Date(s) Administered  . Influenza-Unspecified 10/12/2014  . MMR 01/19/2011  . Tdap 10/01/2014     She is allergic to influenza vaccines.   She  has a past medical history of Hypercholesteremia and Hypertension.    She  reports that she has never smoked. She has never used smokeless tobacco. She reports that she drinks alcohol. She reports that she does not use drugs. She  reports that she does not engage in sexual activity. The patient  has a past surgical history that includes Tubal ligation and Tonsillectomy.  Her family history is not on file.  Review of Systems  Constitutional: Negative for chills and fever.  Respiratory: Negative for cough.   Cardiovascular: Negative for chest pain and leg swelling.  Gastrointestinal: Negative for nausea.  Musculoskeletal: Negative for joint pain and myalgias.  Skin: Negative for rash.  Neurological: Negative for dizziness.  Psychiatric/Behavioral: Negative for depression.    The problem list and medications were reviewed and updated by myself where necessary and exist elsewhere in the encounter.   OBJECTIVE:  BP 124/77    Pulse 80   Temp 98.2 F (36.8 C) (Oral)   Resp 16   Ht 5' 4"  (1.626 m)   Wt 142 lb (64.4 kg)   SpO2 100%   BMI 24.37 kg/m   Physical Exam  Constitutional: She is oriented to person, place, and time. She appears well-developed and well-nourished. No distress.  Cardiovascular: Normal rate, regular rhythm and normal heart sounds.  Exam reveals no gallop and no friction rub.   No murmur heard. Pulmonary/Chest: Effort normal and breath sounds normal. No respiratory distress. She has no wheezes. She has no rales. She exhibits no tenderness.  Abdominal: Soft. Bowel sounds are normal.  Musculoskeletal: Normal range of motion.  Neurological: She is alert and oriented to person, place, and time. She has normal reflexes. She displays normal reflexes. No cranial nerve deficit. She exhibits normal muscle tone. Coordination normal.  Skin: Skin is warm and dry. She is not diaphoretic.  Psychiatric: She has a normal mood and affect.  Vitals reviewed.   No results found for this or any previous visit (from the past 72 hour(s)).  No results found.  Wt Readings from Last 3 Encounters:  12/17/16 142 lb (64.4 kg)  10/11/16 139 lb 3.2 oz (63.1 kg)  05/02/16 138 lb (62.6 kg)    ASSESSMENT AND PLAN:  Haley Vega was seen today for annual exam.  Diagnoses and all orders for this visit:  Annual physical exam: She is doing very  well.  Immunizations up to date.  ASCVD risk at 5.7 despite elevated LDL from last years panel.   Screening for deficiency anemia -     CBC  Screening for nephropathy -     CMP14+EGFR  Screening for lipid disorders -     Lipid panel  Screening for thyroid disorder -     TSH  Screening for diabetes mellitus -     Hemoglobin A1c  Essential hypertension: Well controlled.  Meds for one year, RTC for recheck in 6 months or so.  -     losartan-hydrochlorothiazide (HYZAAR) 50-12.5 MG tablet; Take 0.5 tablets by mouth daily. Do not miss doses. Check pressure one time per  week.    The patient is advised to call or return to clinic if she does not see an improvement in symptoms, or to seek the care of the closest emergency department if she worsens with the above plan.   Philis Fendt, MHS, PA-C [facility] 12/17/2016 9:13 AM

## 2016-12-18 LAB — CMP14+EGFR
A/G RATIO: 1.7 (ref 1.2–2.2)
ALBUMIN: 4.4 g/dL (ref 3.5–5.5)
ALK PHOS: 95 IU/L (ref 39–117)
ALT: 19 IU/L (ref 0–32)
AST: 21 IU/L (ref 0–40)
BUN / CREAT RATIO: 20 (ref 9–23)
BUN: 21 mg/dL (ref 6–24)
Bilirubin Total: 0.4 mg/dL (ref 0.0–1.2)
CHLORIDE: 99 mmol/L (ref 96–106)
CO2: 26 mmol/L (ref 18–29)
Calcium: 9.8 mg/dL (ref 8.7–10.2)
Creatinine, Ser: 1.06 mg/dL — ABNORMAL HIGH (ref 0.57–1.00)
GFR calc Af Amer: 67 mL/min/{1.73_m2} (ref >59)
GFR calc non Af Amer: 58 mL/min/{1.73_m2} — ABNORMAL LOW (ref >59)
GLUCOSE: 90 mg/dL (ref 65–99)
Globulin, Total: 2.6 g/dL (ref 1.5–4.5)
Potassium: 4.4 mmol/L (ref 3.5–5.2)
Sodium: 141 mmol/L (ref 134–144)
Total Protein: 7 g/dL (ref 6.0–8.5)

## 2016-12-18 LAB — CBC
HEMOGLOBIN: 12.7 g/dL (ref 11.1–15.9)
Hematocrit: 38.2 % (ref 34.0–46.6)
MCH: 30.3 pg (ref 26.6–33.0)
MCHC: 33.2 g/dL (ref 31.5–35.7)
MCV: 91 fL (ref 79–97)
PLATELETS: 341 10*3/uL (ref 150–379)
RBC: 4.19 x10E6/uL (ref 3.77–5.28)
RDW: 13.7 % (ref 12.3–15.4)
WBC: 5.4 10*3/uL (ref 3.4–10.8)

## 2016-12-18 LAB — TSH: TSH: 0.612 u[IU]/mL (ref 0.450–4.500)

## 2016-12-18 LAB — LIPID PANEL
CHOLESTEROL TOTAL: 275 mg/dL — AB (ref 100–199)
Chol/HDL Ratio: 3.6 ratio units (ref 0.0–4.4)
HDL: 76 mg/dL (ref >39)
LDL CALC: 178 mg/dL — AB (ref 0–99)
TRIGLYCERIDES: 103 mg/dL (ref 0–149)
VLDL CHOLESTEROL CAL: 21 mg/dL (ref 5–40)

## 2016-12-18 LAB — HEMOGLOBIN A1C
Est. average glucose Bld gHb Est-mCnc: 108 mg/dL
Hgb A1c MFr Bld: 5.4 % (ref 4.8–5.6)

## 2016-12-28 ENCOUNTER — Other Ambulatory Visit: Payer: Self-pay | Admitting: *Deleted

## 2016-12-28 DIAGNOSIS — I10 Essential (primary) hypertension: Secondary | ICD-10-CM

## 2017-01-11 MED FILL — LOSARTAN-HCTZ 50-12.5 MG TA: 50-12.5 | 90 days supply | Qty: 45 | Fill #0

## 2017-01-21 DIAGNOSIS — E7800 Pure hypercholesterolemia, unspecified: Secondary | ICD-10-CM | POA: Diagnosis not present

## 2017-01-21 DIAGNOSIS — I10 Essential (primary) hypertension: Secondary | ICD-10-CM | POA: Diagnosis not present

## 2017-03-15 ENCOUNTER — Encounter: Payer: Self-pay | Admitting: Physician Assistant

## 2017-04-11 ENCOUNTER — Other Ambulatory Visit: Payer: Self-pay | Admitting: Physician Assistant

## 2017-04-11 DIAGNOSIS — Z1231 Encounter for screening mammogram for malignant neoplasm of breast: Secondary | ICD-10-CM

## 2017-04-15 MED FILL — LOSARTAN POTASSIUM-HCTZ 50-: 50-12.5 | 90 days supply | Qty: 45 | Fill #1

## 2017-04-30 ENCOUNTER — Ambulatory Visit
Admission: RE | Admit: 2017-04-30 | Discharge: 2017-04-30 | Disposition: A | Discharge status: Home / Self Care | Payer: 59 | Source: Ambulatory Visit | Attending: Physician Assistant | Admitting: Physician Assistant

## 2017-04-30 DIAGNOSIS — Z1231 Encounter for screening mammogram for malignant neoplasm of breast: Secondary | ICD-10-CM | POA: Diagnosis not present

## 2017-07-11 MED FILL — LOSARTAN POTASSIUM-HCTZ 50-: 50-12.5 | 90 days supply | Qty: 45 | Fill #2

## 2017-10-07 MED FILL — LOSARTAN POTASSIUM-HCTZ 50-: 50-12.5 | 90 days supply | Qty: 45 | Fill #3

## 2017-10-28 ENCOUNTER — Encounter: Payer: Self-pay | Admitting: Physician Assistant

## 2017-10-28 ENCOUNTER — Ambulatory Visit (INDEPENDENT_AMBULATORY_CARE_PROVIDER_SITE_OTHER): Payer: 59 | Admitting: Physician Assistant

## 2017-10-28 ENCOUNTER — Other Ambulatory Visit: Payer: Self-pay

## 2017-10-28 VITALS — BP 124/76 | HR 102 | Temp 98.1°F | Resp 16 | Ht 64.0 in | Wt 147.0 lb

## 2017-10-28 DIAGNOSIS — J0140 Acute pansinusitis, unspecified: Secondary | ICD-10-CM

## 2017-10-28 DIAGNOSIS — H6983 Other specified disorders of Eustachian tube, bilateral: Secondary | ICD-10-CM

## 2017-10-28 DIAGNOSIS — T3695XA Adverse effect of unspecified systemic antibiotic, initial encounter: Principal | ICD-10-CM

## 2017-10-28 DIAGNOSIS — B379 Candidiasis, unspecified: Secondary | ICD-10-CM

## 2017-10-28 MED ORDER — PREDNISONE 20 MG PO TABS: ORAL_TABLET | ORAL | 0 refills | Status: DC

## 2017-10-28 MED ORDER — FLUTICASONE PROPIONATE 50 MCG/ACT NA SUSP: 2.0000 | Freq: Every day | NASAL | 1 refills | Status: DC

## 2017-10-28 MED ORDER — AMOXICILLIN-POT CLAVULANATE 875-125 MG PO TABS: 1.0000 | ORAL_TABLET | Freq: Two times a day (BID) | ORAL | 0 refills | Status: AC

## 2017-10-28 MED ORDER — FLUCONAZOLE 150 MG PO TABS: 150.0000 mg | ORAL_TABLET | Freq: Once | ORAL | 0 refills | Status: AC

## 2017-10-28 MED FILL — FLUTICASONE PROP 50 MCG SPR: 50 | 30 days supply | Qty: 16 | Fill #0

## 2017-10-28 MED FILL — AMOX-CLAV 875-125 MG TABLET: 875-125 | 7 days supply | Qty: 14 | Fill #0

## 2017-10-28 MED FILL — predniSONE 20 MG TABS: 20 | 7 days supply | Qty: 13 | Fill #0

## 2017-10-28 MED FILL — FLUCONAZOLE 150 MG TABLET: 150 | 3 days supply | Qty: 2 | Fill #0

## 2017-10-28 NOTE — Patient Instructions (Signed)
Please hydrate well 64 oz or more.   I would like you to take medication as prescribed.  Sinusitis, Adult Sinusitis is soreness and inflammation of your sinuses. Sinuses are hollow spaces in the bones around your face. They are located:  Around your eyes.  In the middle of your forehead.  Behind your nose.  In your cheekbones.  Your sinuses and nasal passages are lined with a stringy fluid (mucus). Mucus normally drains out of your sinuses. When your nasal tissues get inflamed or swollen, the mucus can get trapped or blocked so air cannot flow through your sinuses. This lets bacteria, viruses, and funguses grow, and that leads to infection. Follow these instructions at home: Medicines  Take, use, or apply over-the-counter and prescription medicines only as told by your doctor. These may include nasal sprays.  If you were prescribed an antibiotic medicine, take it as told by your doctor. Do not stop taking the antibiotic even if you start to feel better. Hydrate and Humidify  Drink enough water to keep your pee (urine) clear or pale yellow.  Use a cool mist humidifier to keep the humidity level in your home above 50%.  Breathe in steam for 10-15 minutes, 3-4 times a day or as told by your doctor. You can do this in the bathroom while a hot shower is running.  Try not to spend time in cool or dry air. Rest  Rest as much as possible.  Sleep with your head raised (elevated).  Make sure to get enough sleep each night. General instructions  Put a warm, moist washcloth on your face 3-4 times a day or as told by your doctor. This will help with discomfort.  Wash your hands often with soap and water. If there is no soap and water, use hand sanitizer.  Do not smoke. Avoid being around people who are smoking (secondhand smoke).  Keep all follow-up visits as told by your doctor. This is important. Contact a doctor if:  You have a fever.  Your symptoms get worse.  Your symptoms  do not get better within 10 days. Get help right away if:  You have a very bad headache.  You cannot stop throwing up (vomiting).  You have pain or swelling around your face or eyes.  You have trouble seeing.  You feel confused.  Your neck is stiff.  You have trouble breathing. This information is not intended to replace advice given to you by your health care provider. Make sure you discuss any questions you have with your health care provider. Document Released: 03/05/2008 Document Revised: 05/13/2016 Document Reviewed: 07/13/2015 Elsevier Interactive Patient Education  [employer].

## 2017-10-28 NOTE — Progress Notes (Signed)
PRIMARY CARE AT [facility] [address] [place] [address] [phone]  Date:  10/28/2017   Name:  Haley Vega   DOB:  [birth year]   MRN:  [id number]  PCP:  Tereasa Coop, PA-C    History of Present Illness:  Haley Vega is a 57 y.o. female patient who presents to PCP with  Chief Complaint  Patient presents with  . Sinusitis    x 1wk     She has had 8 days of congestion, runny nose.  She is sneezing.  She will blow the mucus, and notes some thickness to the content.  She jhas facial pressure at her forehead.  Ear discomfort of both ears.  She also has some teeth pain.  She noted temperature of > She has some coughing.   No sob or dyspnea.   She has taken ibuprofen, loratidine, mucinex chest and congestion, and tylenol.   Patient Active Problem List   Diagnosis Date Noted  . HLD (hyperlipidemia) 01/03/2015  . Essential hypertension 01/03/2015    Past Medical History:  Diagnosis Date  . Hypercholesteremia   . Hypertension     Past Surgical History:  Procedure Laterality Date  . TONSILLECTOMY    . TUBAL LIGATION      Social History   Tobacco Use  . Smoking status: Never Smoker  . Smokeless tobacco: Never Used  Substance Use Topics  . Alcohol use: Yes    Alcohol/week: 0.0 oz    Comment: social 1 drink  . Drug use: No    History reviewed. No pertinent family history.  Allergies  Allergen Reactions  . Influenza Vaccines Swelling    Had significant swelling of face, lips and eyelids    Medication list has been reviewed and updated.  Current Outpatient Medications on File Prior to Visit  Medication Sig Dispense Refill  . aspirin EC 81 MG tablet Take 81 mg by mouth once.    Marland Kitchen losartan-hydrochlorothiazide (HYZAAR) 50-12.5 MG tablet Take 0.5 tablets by mouth daily. Do not miss doses. Check pressure one time per week. 45 tablet 3   No current facility-administered medications on file prior to visit.     ROS ROS otherwise unremarkable unless listed  above.  Physical Examination: BP 124/76   Pulse (!) 102   Temp 98.1 F (36.7 C) (Oral)   Resp 16   Ht 5\' 4"  (1.626 m)   Wt 147 lb (66.7 kg)   SpO2 98%   BMI 25.23 kg/m  Ideal Body Weight: Weight in (lb) to have BMI = 25: 145.3  Physical Exam  Constitutional: She is oriented to person, place, and time. She appears well-developed and well-nourished. No distress.  HENT:  Head: Normocephalic and atraumatic.  Right Ear: Tympanic membrane, external ear and ear canal normal.  Left Ear: Tympanic membrane, external ear and ear canal normal.  Nose: Mucosal edema and rhinorrhea present. Right sinus exhibits no maxillary sinus tenderness and no frontal sinus tenderness. Left sinus exhibits no maxillary sinus tenderness and no frontal sinus tenderness.  Mouth/Throat: No uvula swelling. No oropharyngeal exudate, posterior oropharyngeal edema or posterior oropharyngeal erythema.  Eyes: Conjunctivae and EOM are normal. Pupils are equal, round, and reactive to light.  Cardiovascular: Normal rate and regular rhythm. Exam reveals no gallop, no distant heart sounds and no friction rub.  No murmur heard. Pulmonary/Chest: Effort normal and breath sounds normal. No respiratory distress. She has no decreased breath sounds. She has no wheezes. She has no rhonchi.  Lymphadenopathy:  Head (right side): No submandibular, no tonsillar, no preauricular and no posterior auricular adenopathy present.       Head (left side): No submandibular, no tonsillar, no preauricular and no posterior auricular adenopathy present.  Neurological: She is alert and oriented to person, place, and time.  Skin: She is not diaphoretic.  Psychiatric: She has a normal mood and affect. Her behavior is normal.     Assessment and Plan: Haley Vega is a 57 y.o. female who is here today for cc of  Chief Complaint  Patient presents with  . Sinusitis    x 1wk   Acute non-recurrent pansinusitis - Plan: amoxicillin-clavulanate  (AUGMENTIN) 875-125 MG tablet, fluticasone (FLONASE) 50 MCG/ACT nasal spray, predniSONE (DELTASONE) 20 MG tablet  Eustachian tube dysfunction, bilateral - Plan: amoxicillin-clavulanate (AUGMENTIN) 875-125 MG tablet, fluticasone (FLONASE) 50 MCG/ACT nasal spray, predniSONE (DELTASONE) 20 MG tablet  Ivar Drape, PA-C [facility] 1/28/201910:18 AM

## 2017-12-11 ENCOUNTER — Encounter: Payer: Self-pay | Admitting: Physician Assistant

## 2017-12-18 ENCOUNTER — Ambulatory Visit (INDEPENDENT_AMBULATORY_CARE_PROVIDER_SITE_OTHER): Payer: 59 | Admitting: Physician Assistant

## 2017-12-18 ENCOUNTER — Encounter: Payer: Self-pay | Admitting: Physician Assistant

## 2017-12-18 ENCOUNTER — Other Ambulatory Visit: Payer: Self-pay

## 2017-12-18 VITALS — BP 120/70 | HR 89 | Temp 98.3°F | Resp 18 | Ht 64.0 in | Wt 148.2 lb

## 2017-12-18 DIAGNOSIS — Z1321 Encounter for screening for nutritional disorder: Secondary | ICD-10-CM

## 2017-12-18 DIAGNOSIS — Z13228 Encounter for screening for other metabolic disorders: Secondary | ICD-10-CM | POA: Diagnosis not present

## 2017-12-18 DIAGNOSIS — Z0000 Encounter for general adult medical examination without abnormal findings: Secondary | ICD-10-CM

## 2017-12-18 DIAGNOSIS — I10 Essential (primary) hypertension: Secondary | ICD-10-CM

## 2017-12-18 DIAGNOSIS — Z130 Encounter for screening for diseases of the blood and blood-forming organs and certain disorders involving the immune mechanism: Secondary | ICD-10-CM

## 2017-12-18 DIAGNOSIS — Z86010 Personal history of colonic polyps: Secondary | ICD-10-CM

## 2017-12-18 DIAGNOSIS — Z1329 Encounter for screening for other suspected endocrine disorder: Secondary | ICD-10-CM

## 2017-12-18 DIAGNOSIS — R7989 Other specified abnormal findings of blood chemistry: Secondary | ICD-10-CM

## 2017-12-18 MED ORDER — ZOSTER VAC RECOMB ADJUVANTED 50 MCG/0.5ML IM SUSR: 0.5000 mL | Freq: Once | INTRAMUSCULAR | 1 refills | Status: AC

## 2017-12-18 MED ORDER — LOSARTAN POTASSIUM-HCTZ 50-12.5 MG PO TABS: 0.5000 | ORAL_TABLET | Freq: Every day | ORAL | 3 refills | Status: DC

## 2017-12-18 MED FILL — LOSARTAN POTASSIUM-HCTZ 50-: 50-12.5 | 90 days supply | Qty: 45 | Fill #0

## 2017-12-18 NOTE — Patient Instructions (Signed)
     IF you received an x-ray today, you will receive an invoice from [facility]. Please contact  Radiology at [phone] with questions or concerns regarding your invoice.   IF you received labwork today, you will receive an invoice from [facility]. Please contact [facility] at [phone] with questions or concerns regarding your invoice.   Our billing staff will not be able to assist you with questions regarding bills from these companies.  You will be contacted with the lab results as soon as they are available. The fastest way to get your results is to activate your My Chart account. Instructions are located on the last page of this paperwork. If you have not heard from us regarding the results in 2 weeks, please contact this office.     

## 2017-12-18 NOTE — Progress Notes (Signed)
12/18/2017 11:08 AM   DOB: May 19, 1961 / MRN: [id number]  SUBJECTIVE:  Haley Vega is a 57 y.o. female presenting for an annual physical and has no complaints today.  With regard to exercise, she tells me likes to walk.  She is up-to-date on all of her current health maintenance including mammogram which was done about 6 months ago normal.  She had a normal colonoscopy back in 2015 with a 5-year return recommendation.  That document also states that she has a personal history of adenomatous polyps however I cannot find this anywhere else in her record.  She is wary of getting the shingles vaccination.  She is taking her blood pressure medicine without any side effects and states "I feel great."  Immunization History  Administered Date(s) Administered  . Influenza-Unspecified 10/12/2014  . MMR 01/19/2011  . Tdap 10/01/2014    Health Maintenance Due  Topic  . HIV Screening     She is allergic to influenza vaccines.   She  has a past medical history of Hypercholesteremia and Hypertension.    She  reports that  has never smoked. she has never used smokeless tobacco. She reports that she drinks alcohol. She reports that she does not use drugs. She  reports that she does not engage in sexual activity. The patient  has a past surgical history that includes Tubal ligation and Tonsillectomy.  Her family history is not on file.  Review of Systems  Constitutional: Negative for chills, diaphoresis and fever.  Eyes: Negative.   Respiratory: Negative for cough, hemoptysis, sputum production, shortness of breath and wheezing.   Cardiovascular: Negative for chest pain, orthopnea and leg swelling.  Gastrointestinal: Negative for abdominal pain, blood in stool, constipation, diarrhea, heartburn, melena, nausea and vomiting.  Genitourinary: Negative for flank pain.  Skin: Negative for rash.  Neurological: Negative for dizziness, sensory change, speech change, focal weakness and headaches.     Problem list and medications reviewed and updated by myself where necessary, and exist elsewhere in the encounter.   OBJECTIVE:  BP 120/70 (BP Location: Left Arm, Patient Position: Sitting, Cuff Size: Normal)   Pulse 89   Temp 98.3 F (36.8 C) (Oral)   Resp 18   Ht 5' 4"  (1.626 m)   Wt 148 lb 3.2 oz (67.2 kg)   SpO2 100%   BMI 25.44 kg/m   Physical Exam  Constitutional: She is oriented to person, place, and time. She is active.  Non-toxic appearance.  Eyes: EOM are normal. Pupils are equal, round, and reactive to light.  Cardiovascular: Normal rate, regular rhythm, S1 normal, S2 normal, normal heart sounds and intact distal pulses. Exam reveals no gallop, no friction rub and no decreased pulses.  No murmur heard. Pulmonary/Chest: Effort normal. No stridor. No tachypnea. No respiratory distress. She has no wheezes. She has no rales.  Abdominal: Soft. Normal appearance and bowel sounds are normal. She exhibits no distension and no mass. There is no tenderness. There is no rigidity, no rebound, no guarding and no CVA tenderness.  Musculoskeletal: She exhibits no edema.  Neurological: She is alert and oriented to person, place, and time. She has normal strength and normal reflexes. She is not disoriented. She displays no atrophy. No cranial nerve deficit or sensory deficit. She exhibits normal muscle tone. Coordination and gait normal.  Skin: Skin is warm and dry. She is not diaphoretic. No pallor.  Psychiatric: Her behavior is normal.    Lab Results  Component Value Date   WBC 5.4  12/17/2016   HGB 12.7 12/17/2016   HCT 38.2 12/17/2016   MCV 91 12/17/2016   PLT 341 12/17/2016    Lab Results  Component Value Date   NA 141 12/17/2016   K 4.4 12/17/2016   CL 99 12/17/2016   CO2 26 12/17/2016    Lab Results  Component Value Date   CREATININE 1.06 (H) 12/17/2016    Lab Results  Component Value Date   ALT 19 12/17/2016   AST 21 12/17/2016   ALKPHOS 95 12/17/2016    BILITOT 0.4 12/17/2016    Lab Results  Component Value Date   TSH 0.612 12/17/2016   Lab Results  Component Value Date   HGBA1C 5.4 12/17/2016   Lab Results  Component Value Date   CHOL 275 (H) 12/17/2016   HDL 76 12/17/2016   LDLCALC 178 (H) 12/17/2016   TRIG 103 12/17/2016   CHOLHDL 3.6 12/17/2016    The 10-year ASCVD risk score Haley Bussing DC Jr., et al., 2013) is: 5.7%   Values used to calculate the score:     Age: 48 years     Sex: Female     Is Non-Hispanic African American: Yes     Diabetic: No     Tobacco smoker: No     Systolic Blood Pressure: 171 mmHg     Is BP treated: Yes     HDL Cholesterol: 76 mg/dL     Total Cholesterol: 275 mg/dL  ASSESSMENT AND PLAN  [name] was seen today for annual exam.  Diagnoses and all orders for this visit:  Annual physical exam  Screening for endocrine, nutritional, metabolic and immunity disorder -     CBC -     Lipid panel -     TSH -     Hemoglobin A1c -     HIV antibody -     Hepatitis C antibody -     MM DIGITAL SCREENING BILATERAL; Future  Essential hypertension -     losartan-hydrochlorothiazide (HYZAAR) 50-12.5 MG tablet; Take 0.5 tablets by mouth daily. Do not miss doses. Check pressure one time per week.  Other orders -     Zoster Vaccine Adjuvanted [facility]) injection; Inject 0.5 mLs into the muscle once for 1 dose. Due for booster in 6 months.    The patient was advised to call or return to clinic if she does not see an improvement in symptoms or to seek the care of the closest emergency department if she worsens with the above plan.   Philis Fendt, MHS, PA-C [facility] 12/18/2017 11:08 AM

## 2017-12-19 LAB — LIPID PANEL
CHOLESTEROL TOTAL: 266 mg/dL — AB (ref 100–199)
Chol/HDL Ratio: 3.7 ratio (ref 0.0–4.4)
HDL: 72 mg/dL (ref >39)
LDL CALC: 164 mg/dL — AB (ref 0–99)
Triglycerides: 148 mg/dL (ref 0–149)
VLDL CHOLESTEROL CAL: 30 mg/dL (ref 5–40)

## 2017-12-19 LAB — CBC
HEMOGLOBIN: 13.1 g/dL (ref 11.1–15.9)
Hematocrit: 39 % (ref 34.0–46.6)
MCH: 29.6 pg (ref 26.6–33.0)
MCHC: 33.6 g/dL (ref 31.5–35.7)
MCV: 88 fL (ref 79–97)
Platelets: 367 10*3/uL (ref 150–379)
RBC: 4.42 x10E6/uL (ref 3.77–5.28)
RDW: 13.8 % (ref 12.3–15.4)
WBC: 5.9 10*3/uL (ref 3.4–10.8)

## 2017-12-19 LAB — HIV ANTIBODY (ROUTINE TESTING W REFLEX): HIV Screen 4th Generation wRfx: NONREACTIVE

## 2017-12-19 LAB — TSH: TSH: 0.311 u[IU]/mL — ABNORMAL LOW (ref 0.450–4.500)

## 2017-12-19 LAB — HEPATITIS C ANTIBODY: Hep C Virus Ab: <0.1 s/co ratio (ref 0.0–0.9)

## 2017-12-19 LAB — HEMOGLOBIN A1C
Est. average glucose Bld gHb Est-mCnc: 114 mg/dL
Hgb A1c MFr Bld: 5.6 % (ref 4.8–5.6)

## 2017-12-20 DIAGNOSIS — R7989 Other specified abnormal findings of blood chemistry: Secondary | ICD-10-CM | POA: Diagnosis not present

## 2017-12-20 NOTE — Addendum Note (Signed)
Addended by: Gari Crown D on: 12/20/2017 05:18 PM   Modules accepted: Orders

## 2017-12-20 NOTE — Progress Notes (Signed)
Please add on free T4.

## 2017-12-21 LAB — T4, FREE: Free T4: 1.1 ng/dL (ref 0.82–1.77)

## 2017-12-23 NOTE — Telephone Encounter (Signed)
Patient spoke with gastroenterology and they advised it is okay to wait until 2020 given most recent normal colonoscopy despite history of adenomatous polyps.

## 2018-01-08 ENCOUNTER — Encounter: Payer: 59 | Admitting: Obstetrics & Gynecology

## 2018-01-08 ENCOUNTER — Encounter: Payer: Self-pay | Admitting: Physician Assistant

## 2018-01-23 ENCOUNTER — Encounter: Payer: Self-pay | Admitting: Physician Assistant

## 2018-04-14 MED FILL — LOSARTAN-HCTZ 50-12.5 MG TA: 50-12.5 | 90 days supply | Qty: 45 | Fill #1

## 2018-04-28 ENCOUNTER — Encounter: Payer: Self-pay | Admitting: Physician Assistant

## 2018-05-02 ENCOUNTER — Ambulatory Visit
Admission: RE | Admit: 2018-05-02 | Discharge: 2018-05-02 | Disposition: A | Discharge status: Home / Self Care | Payer: 59 | Source: Ambulatory Visit | Attending: Physician Assistant | Admitting: Physician Assistant

## 2018-05-02 DIAGNOSIS — Z1231 Encounter for screening mammogram for malignant neoplasm of breast: Secondary | ICD-10-CM | POA: Diagnosis not present

## 2018-05-02 DIAGNOSIS — Z13228 Encounter for screening for other metabolic disorders: Principal | ICD-10-CM

## 2018-05-02 DIAGNOSIS — Z130 Encounter for screening for diseases of the blood and blood-forming organs and certain disorders involving the immune mechanism: Secondary | ICD-10-CM

## 2018-05-02 DIAGNOSIS — Z1321 Encounter for screening for nutritional disorder: Principal | ICD-10-CM

## 2018-05-02 DIAGNOSIS — Z1329 Encounter for screening for other suspected endocrine disorder: Principal | ICD-10-CM

## 2018-06-29 ENCOUNTER — Encounter: Payer: Self-pay | Admitting: Physician Assistant

## 2018-07-22 ENCOUNTER — Other Ambulatory Visit: Payer: Self-pay

## 2018-07-22 ENCOUNTER — Ambulatory Visit (INDEPENDENT_AMBULATORY_CARE_PROVIDER_SITE_OTHER): Payer: 59 | Admitting: Physician Assistant

## 2018-07-22 ENCOUNTER — Encounter: Payer: Self-pay | Admitting: Physician Assistant

## 2018-07-22 VITALS — BP 139/73 | HR 78 | Temp 98.1°F | Resp 18 | Ht 64.0 in | Wt 140.4 lb

## 2018-07-22 DIAGNOSIS — I10 Essential (primary) hypertension: Secondary | ICD-10-CM

## 2018-07-22 MED ORDER — OLMESARTAN MEDOXOMIL 20 MG PO TABS: 10.0000 mg | ORAL_TABLET | Freq: Every day | ORAL | 0 refills | Status: DC

## 2018-07-22 MED FILL — OLMESARTAN MEDOXOMIL 20 MG: 20 | 60 days supply | Qty: 30 | Fill #0

## 2018-07-22 NOTE — Progress Notes (Signed)
MRN: [id number] DOB: [birth year]  Subjective:   Haley Vega is a 57 y.o. female presenting for follow up to discuss bp meds. Has been on losartan-HCTZ 50-12.5 mg half tablet for years.  She has been having muscle aches for as long as she can remember being on this medication.  Also concerned with the recall.  She would like to talk about changing medication at this time.  She has tried amlodipine in the past and it made her pee a lot.  Has not taken her blood pressure medication one day. Patient is checking blood pressure at home, range is 662H systolic.Denies lightheadedness, dizziness, chronic headache, double vision, chest pain, shortness of breath, heart racing, palpitations, nausea, vomiting, abdominal pain, hematuria, lower leg swelling. Lifestyle: Has completely changed her lifestyle in the past year.  She is working out frequently.  Has changed her diet to avoid high salty foods.  Eating more salads and vegetables.  Avoiding red meat.  Drinking mostly water.  Denies smoking. Denies any other aggravating or relieving factors, no other questions or concerns.  Haley Vega has a current medication list which includes the following prescription(s): aspirin ec and losartan-hydrochlorothiazide. Also is allergic to influenza vaccines.  Haley Vega  has a past medical history of Hypercholesteremia and Hypertension. Also  has a past surgical history that includes Tubal ligation and Tonsillectomy.   Objective:   Vitals: BP 139/73   Pulse 78   Temp 98.1 F (36.7 C) (Oral)   Resp 18   Ht 5' 4" (1.626 m)   Wt 140 lb 6.4 oz (63.7 kg)   SpO2 99%   BMI 24.10 kg/m   Physical Exam  Constitutional: She is oriented to person, place, and time. She appears well-developed and well-nourished. No distress.  HENT:  Head: Normocephalic and atraumatic.  Mouth/Throat: Uvula is midline, oropharynx is clear and moist and mucous membranes are normal.  Eyes: Pupils are equal, round, and reactive to light. Conjunctivae  and EOM are normal.  Neck: Normal range of motion.  Cardiovascular: Normal rate, regular rhythm, normal heart sounds and intact distal pulses.  Pulmonary/Chest: Effort normal and breath sounds normal. She has no wheezes. She has no rhonchi. She has no rales.  Musculoskeletal:       Right lower leg: She exhibits no swelling.       Left lower leg: She exhibits no swelling.  Neurological: She is alert and oriented to person, place, and time.  Skin: Skin is warm and dry.  Psychiatric: She has a normal mood and affect.  Vitals reviewed.   No results found for this or any previous visit (from the past 24 hour(s)).  BP Readings from Last 3 Encounters:  07/22/18 139/73  12/18/17 120/70  10/28/17 124/76   Wt Readings from Last 3 Encounters:  07/22/18 140 lb 6.4 oz (63.7 kg)  12/18/17 148 lb 3.2 oz (67.2 kg)  10/28/17 147 lb (66.7 kg)    Assessment and Plan :  1. Essential hypertension Goal is 130/80..  BP today 139/73.  Typically she is in the 120s.  She has not been on blood pressure medication one day. Unclear if it is the losartan or the HCTZ medication causing muscle aches. Considering she is on low-dose of both medications, suspect patient only needs 1 blood pressure medication and may not warrant blood pressure medication at all at this time considering she has made significant lifestyle modifications.  Recommend trial off BP medication.  Continue checking blood pressure frequently.  Given Rx for  olmesartan to start if BP readings consistently greater than 140/90.  If they remain controlled, she does not have to start start medication.  Otherwise, follow-up in office in 2 to 3 weeks for reevaluation.CMP pending. - CMP14+EGFR  Meds ordered this encounter  Medications  . olmesartan (BENICAR) 20 MG tablet    Sig: Take 0.5 tablets (10 mg total) by mouth daily.    Dispense:  30 tablet    Refill:  0    Order Specific Question:   Supervising Provider    Answer:   Horald Pollen  [[id number]]    Tenna Delaine, PA-C  [facility] [place] 07/22/2018 3:23 PM

## 2018-07-22 NOTE — Patient Instructions (Addendum)
Stop losartan-hctz.  Start checking blood pressure at least a few times per week and document these values.  Your goal is <140/90. If your values are consistently above this goal, please start half a tablet of benicar daily. If this does not improve your blood pressure to below 140/90, take one full tablet.   Plan to return to the office in 3 weeks for reevaluation.     If you have lab work done today you will be contacted with your lab results within the next 2 weeks.  If you have not heard from [place] then please contact us. The fastest way to get your results is to register for My Chart.   Hypertension Hypertension, commonly called high blood pressure, is when the force of blood pumping through the arteries is too strong. The arteries are the blood vessels that carry blood from the heart throughout the body. Hypertension forces the heart to work harder to pump blood and may cause arteries to become narrow or stiff. Having untreated or uncontrolled hypertension can cause heart attacks, strokes, kidney disease, and other problems. A blood pressure reading consists of a higher number over a lower number. Ideally, your blood pressure should be below 120/80. The first ("top") number is called the systolic pressure. It is a measure of the pressure in your arteries as your heart beats. The second ("bottom") number is called the diastolic pressure. It is a measure of the pressure in your arteries as the heart relaxes. What are the causes? The cause of this condition is not known. What increases the risk? Some risk factors for high blood pressure are under your control. Others are not. Factors you can change  Smoking.  Having type 2 diabetes mellitus, high cholesterol, or both.  Not getting enough exercise or physical activity.  Being overweight.  Having too much fat, sugar, calories, or salt (sodium) in your diet.  Drinking too much alcohol. Factors that are difficult or impossible to  change  Having chronic kidney disease.  Having a family history of high blood pressure.  Age. Risk increases with age.  Race. You may be at higher risk if you are African-American.  Gender. Men are at higher risk than women before age 70. After age 54, women are at higher risk than men.  Having obstructive sleep apnea.  Stress. What are the signs or symptoms? Extremely high blood pressure (hypertensive crisis) may cause:  Headache.  Anxiety.  Shortness of breath.  Nosebleed.  Nausea and vomiting.  Severe chest pain.  Jerky movements you cannot control (seizures).  How is this diagnosed? This condition is diagnosed by measuring your blood pressure while you are seated, with your arm resting on a surface. The cuff of the blood pressure monitor will be placed directly against the skin of your upper arm at the level of your heart. It should be measured at least twice using the same arm. Certain conditions can cause a difference in blood pressure between your right and left arms. Certain factors can cause blood pressure readings to be lower or higher than normal (elevated) for a short period of time:  When your blood pressure is higher when you are in a health care provider's office than when you are at home, this is called white coat hypertension. Most people with this condition do not need medicines.  When your blood pressure is higher at home than when you are in a health care provider's office, this is called masked hypertension. Most people with this condition  may need medicines to control blood pressure.  If you have a high blood pressure reading during one visit or you have normal blood pressure with other risk factors:  You may be asked to return on a different day to have your blood pressure checked again.  You may be asked to monitor your blood pressure at home for 1 week or longer.  If you are diagnosed with hypertension, you may have other blood or imaging tests  to help your health care provider understand your overall risk for other conditions. How is this treated? This condition is treated by making healthy lifestyle changes, such as eating healthy foods, exercising more, and reducing your alcohol intake. Your health care provider may prescribe medicine if lifestyle changes are not enough to get your blood pressure under control, and if:  Your systolic blood pressure is above 130.  Your diastolic blood pressure is above 80.  Your personal target blood pressure may vary depending on your medical conditions, your age, and other factors. Follow these instructions at home: Eating and drinking  Eat a diet that is high in fiber and potassium, and low in sodium, added sugar, and fat. An example eating plan is called the DASH (Dietary Approaches to Stop Hypertension) diet. To eat this way: ? Eat plenty of fresh fruits and vegetables. Try to fill half of your plate at each meal with fruits and vegetables. ? Eat whole grains, such as whole wheat pasta, brown rice, or whole grain bread. Fill about one quarter of your plate with whole grains. ? Eat or drink low-fat dairy products, such as skim milk or low-fat yogurt. ? Avoid fatty cuts of meat, processed or cured meats, and poultry with skin. Fill about one quarter of your plate with lean proteins, such as fish, chicken without skin, beans, eggs, and tofu. ? Avoid premade and processed foods. These tend to be higher in sodium, added sugar, and fat.  Reduce your daily sodium intake. Most people with hypertension should eat less than 1,500 mg of sodium a day.  Limit alcohol intake to no more than 1 drink a day for nonpregnant women and 2 drinks a day for men. One drink equals 12 oz of beer, 5 oz of wine, or 1 oz of hard liquor. Lifestyle  Work with your health care provider to maintain a healthy body weight or to lose weight. Ask what an ideal weight is for you.  Get at least 30 minutes of exercise that  causes your heart to beat faster (aerobic exercise) most days of the week. Activities may include walking, swimming, or biking.  Include exercise to strengthen your muscles (resistance exercise), such as pilates or lifting weights, as part of your weekly exercise routine. Try to do these types of exercises for 30 minutes at least 3 days a week.  Do not use any products that contain nicotine or tobacco, such as cigarettes and e-cigarettes. If you need help quitting, ask your health care provider.  Monitor your blood pressure at home as told by your health care provider.  Keep all follow-up visits as told by your health care provider. This is important. Medicines  Take over-the-counter and prescription medicines only as told by your health care provider. Follow directions carefully. Blood pressure medicines must be taken as prescribed.  Do not skip doses of blood pressure medicine. Doing this puts you at risk for problems and can make the medicine less effective.  Ask your health care provider about side effects or reactions  to medicines that you should watch for. Contact a health care provider if:  You think you are having a reaction to a medicine you are taking.  You have headaches that keep coming back (recurring).  You feel dizzy.  You have swelling in your ankles.  You have trouble with your vision. Get help right away if:  You develop a severe headache or confusion.  You have unusual weakness or numbness.  You feel faint.  You have severe pain in your chest or abdomen.  You vomit repeatedly.  You have trouble breathing. Summary  Hypertension is when the force of blood pumping through your arteries is too strong. If this condition is not controlled, it may put you at risk for serious complications.  Your personal target blood pressure may vary depending on your medical conditions, your age, and other factors. For most people, a normal blood pressure is less than  120/80.  Hypertension is treated with lifestyle changes, medicines, or a combination of both. Lifestyle changes include weight loss, eating a healthy, low-sodium diet, exercising more, and limiting alcohol. This information is not intended to replace advice given to you by your health care provider. Make sure you discuss any questions you have with your health care provider. Document Released: 09/17/2005 Document Revised: 08/15/2016 Document Reviewed: 08/15/2016 Elsevier Interactive Patient Education  2018 [employer].  IF you received an x-ray today, you will receive an invoice from [facility]. Please contact [facility] at [phone] with questions or concerns regarding your invoice.   IF you received labwork today, you will receive an invoice from [facility]. Please contact [facility] at [phone] with questions or concerns regarding your invoice.   Our billing staff will not be able to assist you with questions regarding bills from these companies.  You will be contacted with the lab results as soon as they are available. The fastest way to get your results is to activate your My Chart account. Instructions are located on the last page of this paperwork. If you have not heard from [place] regarding the results in 2 weeks, please contact this office.

## 2018-07-23 LAB — CMP14+EGFR
A/G RATIO: 1.3 (ref 1.2–2.2)
ALBUMIN: 4.4 g/dL (ref 3.5–5.5)
ALT: 13 IU/L (ref 0–32)
AST: 19 IU/L (ref 0–40)
Alkaline Phosphatase: 77 IU/L (ref 39–117)
BUN / CREAT RATIO: 18 (ref 9–23)
BUN: 10 mg/dL (ref 6–24)
Bilirubin Total: 0.3 mg/dL (ref 0.0–1.2)
CALCIUM: 9.6 mg/dL (ref 8.7–10.2)
CO2: 16 mmol/L — AB (ref 20–29)
CREATININE: 0.57 mg/dL (ref 0.57–1.00)
Chloride: 104 mmol/L (ref 96–106)
GFR calc non Af Amer: 102 mL/min/{1.73_m2} (ref >59)
GFR, EST AFRICAN AMERICAN: 117 mL/min/{1.73_m2} (ref >59)
GLOBULIN, TOTAL: 3.3 g/dL (ref 1.5–4.5)
Glucose: 88 mg/dL (ref 65–99)
POTASSIUM: 4.1 mmol/L (ref 3.5–5.2)
SODIUM: 138 mmol/L (ref 134–144)
Total Protein: 7.7 g/dL (ref 6.0–8.5)

## 2018-08-27 MED FILL — LOSARTAN-HCTZ 50-12.5 MG TA: 50-12.5 | 90 days supply | Qty: 45 | Fill #2

## 2018-09-01 DIAGNOSIS — D225 Melanocytic nevi of trunk: Secondary | ICD-10-CM | POA: Diagnosis not present

## 2018-09-01 DIAGNOSIS — L810 Postinflammatory hyperpigmentation: Secondary | ICD-10-CM | POA: Diagnosis not present

## 2018-09-01 DIAGNOSIS — L2084 Intrinsic (allergic) eczema: Secondary | ICD-10-CM | POA: Diagnosis not present

## 2018-09-01 MED FILL — TRIAMCINOLONE 0.1% CREAM: 0.1 | 30 days supply | Qty: 60 | Fill #0

## 2018-12-16 MED FILL — LOSARTAN-HCTZ 50-12.5 MG TA: 50-12.5 | 90 days supply | Qty: 45 | Fill #3

## 2018-12-31 ENCOUNTER — Encounter: Payer: Self-pay | Admitting: Physician Assistant

## 2019-03-20 ENCOUNTER — Other Ambulatory Visit (HOSPITAL_COMMUNITY)
Admission: RE | Admit: 2019-03-20 | Discharge: 2019-03-20 | Disposition: A | Discharge status: Home / Self Care | Payer: 59 | Source: Ambulatory Visit | Attending: Family Medicine | Admitting: Family Medicine

## 2019-03-20 ENCOUNTER — Ambulatory Visit (INDEPENDENT_AMBULATORY_CARE_PROVIDER_SITE_OTHER): Payer: 59 | Admitting: Family Medicine

## 2019-03-20 ENCOUNTER — Encounter: Payer: Self-pay | Admitting: Family Medicine

## 2019-03-20 ENCOUNTER — Telehealth: Payer: Self-pay | Admitting: Gastroenterology

## 2019-03-20 ENCOUNTER — Other Ambulatory Visit: Payer: Self-pay

## 2019-03-20 VITALS — BP 134/76 | HR 69 | Temp 98.4°F | Resp 17 | Ht 64.0 in | Wt 131.4 lb

## 2019-03-20 DIAGNOSIS — Z124 Encounter for screening for malignant neoplasm of cervix: Secondary | ICD-10-CM

## 2019-03-20 DIAGNOSIS — R7989 Other specified abnormal findings of blood chemistry: Secondary | ICD-10-CM

## 2019-03-20 DIAGNOSIS — Z0001 Encounter for general adult medical examination with abnormal findings: Secondary | ICD-10-CM

## 2019-03-20 DIAGNOSIS — Z1211 Encounter for screening for malignant neoplasm of colon: Secondary | ICD-10-CM | POA: Diagnosis not present

## 2019-03-20 DIAGNOSIS — I10 Essential (primary) hypertension: Secondary | ICD-10-CM | POA: Diagnosis not present

## 2019-03-20 DIAGNOSIS — Z0000 Encounter for general adult medical examination without abnormal findings: Secondary | ICD-10-CM

## 2019-03-20 MED ORDER — LOSARTAN POTASSIUM-HCTZ 50-12.5 MG PO TABS: 0.5000 | ORAL_TABLET | Freq: Every day | ORAL | 3 refills | Status: DC

## 2019-03-20 MED FILL — LOSARTAN-HCTZ 50-12.5 MG TA: 50-12.5 | 90 days supply | Qty: 45 | Fill #0

## 2019-03-20 NOTE — Telephone Encounter (Signed)
Reviewed her records.  Colonoscopy in 2015 was normal, but given recommendation to repeat in 5 years.  Unsure if she has a family history of CRC or a personal history of polyps as the reason for the shortened interval for repeat screening.  I cannot find any record of either in the chart.  I am happy to have a appointment with her in the clinic to discuss this in detail so that we can formulate a plan for the appropriate interval for repeat colonoscopy.  Thank you.

## 2019-03-20 NOTE — Telephone Encounter (Signed)
Hi Dr. Bryan Lemma, we received a referral from pt's PCP for a repeat colon. Pt had a colon in 2015 at [facility]. Records are in Epic in the "procedures tab." Could you please review records and advise  on scheduling? Thank you.

## 2019-03-20 NOTE — Progress Notes (Signed)
Chief Complaint  Patient presents with  . Annual Exam    no pap and no other concerns    Subjective:  Haley Vega is a 58 y.o. female here for a health maintenance visit.  Patient is established pt She is a former patient of [facility] [place]  Patient Active Problem List   Diagnosis Date Noted  . HLD (hyperlipidemia) 01/03/2015  . Essential hypertension 01/03/2015    Past Medical History:  Diagnosis Date  . Hypercholesteremia   . Hypertension     Past Surgical History:  Procedure Laterality Date  . TONSILLECTOMY    . TUBAL LIGATION       Outpatient Medications Prior to Visit  Medication Sig Dispense Refill  . Coenzyme Q10 (COQ-10) 100 MG CAPS Take 1 capsule by mouth daily.    Marland Kitchen aspirin EC 81 MG tablet Take 81 mg by mouth once.    Marland Kitchen losartan-hydrochlorothiazide (HYZAAR) 50-12.5 MG tablet Take 0.5 tablets by mouth daily.    Marland Kitchen olmesartan (BENICAR) 20 MG tablet Take 0.5 tablets (10 mg total) by mouth daily. 30 tablet 0  . aspirin EC 81 MG tablet Take by mouth.     No facility-administered medications prior to visit.     Allergies  Allergen Reactions  . Influenza Vaccines Swelling    Had significant swelling of face, lips and eyelids     No family history on file.   Health Habits: Dental Exam: up to date Eye Exam: up to date Exercise: 5 times/week on average Current exercise activities: walking/running Diet: balanced   Social History   Socioeconomic History  . Marital status: Single    Spouse name: Not on file  . Number of children: Not on file  . Years of education: Not on file  . Highest education level: Not on file  Occupational History  . Occupation: Tour manager: [employer]  . Financial resource strain: Not on file  . Food insecurity    Worry: Not on file    Inability: Not on file  . Transportation needs    Medical: Not on file    Non-medical: Not on file  Tobacco Use  . Smoking status: Never Smoker  .  Smokeless tobacco: Never Used  Substance and Sexual Activity  . Alcohol use: Yes    Alcohol/week: 0.0 standard drinks    Comment: social 1 drink  . Drug use: No  . Sexual activity: Never  Lifestyle  . Physical activity    Days per week: Not on file    Minutes per session: Not on file  . Stress: Not on file  Relationships  . Social Herbalist on phone: Not on file    Gets together: Not on file    Attends religious service: Not on file    Active member of club or organization: Not on file    Attends meetings of clubs or organizations: Not on file    Relationship status: Not on file  . Intimate partner violence    Fear of current or ex partner: Not on file    Emotionally abused: Not on file    Physically abused: Not on file    Forced sexual activity: Not on file  Other Topics Concern  . Not on file  Social History Narrative   Divorced. Education: [employer]. Exercise: Yes.   Works for [employer] History   Substance and Sexual Activity  Alcohol Use Yes  . Alcohol/week:  0.0 standard drinks   Comment: social 1 drink   Social History   Tobacco Use  Smoking Status Never Smoker  Smokeless Tobacco Never Used   Social History   Substance and Sexual Activity  Drug Use No    GYN: Sexual Health Menstrual status: regular menses LMP: No LMP recorded. Patient is postmenopausal. Last pap smear: see HM section History of abnormal pap smears:  Sexually active: not active Current contraception:   Health Maintenance: See under health Maintenance activity for review of completion dates as well. Immunization History  Administered Date(s) Administered  . Influenza-Unspecified 10/12/2014  . MMR 01/19/2011  . Tdap 10/01/2014      Depression Screen-PHQ2/9 Depression screen [facility] 2/9 03/20/2019 07/22/2018 12/18/2017 10/28/2017 12/17/2016  Decreased Interest 0 0 0 0 0  Down, Depressed, Hopeless 0 0 0 0 0  PHQ - 2 Score 0 0 0 0 0       Depression Severity and  Treatment Recommendations:  0-4= None  5-9= Mild / Treatment: Support, educate to call if worse; return in one month  10-14= Moderate / Treatment: Support, watchful waiting; Antidepressant or Psycotherapy  15-19= Moderately severe / Treatment: Antidepressant OR Psychotherapy  >= 20 = Major depression, severe / Antidepressant AND Psychotherapy    Review of Systems   ROS  See HPI for ROS as well.   Review of Systems  Constitutional: Negative for activity change, appetite change, chills and fever.  HENT: Negative for congestion, nosebleeds, trouble swallowing and voice change.   Respiratory: Negative for cough, shortness of breath and wheezing.   Gastrointestinal: Negative for diarrhea, nausea and vomiting.  Genitourinary: Negative for difficulty urinating, dysuria, flank pain and hematuria.  Musculoskeletal: Negative for back pain, joint swelling and neck pain.  Neurological: Negative for dizziness, speech difficulty, light-headedness and numbness.  See HPI. All other review of systems negative.    Objective:   Vitals:   03/20/19 1518 03/20/19 1601  BP: (!) 162/85 134/76  Pulse: 69   Resp: 17   Temp: 98.4 F (36.9 C)   TempSrc: Oral   SpO2: 99%   Weight: 131 lb 6.4 oz (59.6 kg)   Height: 5' 4"  (1.626 m)     Body mass index is 22.55 kg/m.  Physical Exam  BP 134/76 (BP Location: Left Arm, Patient Position: Sitting, Cuff Size: Normal)   Pulse 69   Temp 98.4 F (36.9 C) (Oral)   Resp 17   Ht 5' 4"  (1.626 m)   Wt 131 lb 6.4 oz (59.6 kg)   SpO2 99%   BMI 22.55 kg/m   CHAPERONE PRESENT  General Appearance:    Alert, cooperative, no distress, appears stated age  Head:    Normocephalic, without obvious abnormality, atraumatic  Eyes:    PERRL, conjunctiva/corneas clear, EOM's intact both eyes  Ears:    Normal TM's and external ear canals, both ears  Nose:   Nares normal, septum midline, mucosa normal, no drainage    or sinus tenderness  Throat:   Lips, mucosa, and  tongue normal; teeth and gums normal  Neck:   Supple, symmetrical, trachea midline, no adenopathy;    thyroid:  no enlargement/tenderness/nodules; no carotid   bruit or JVD  Back:     Symmetric, no curvature, ROM normal, no CVA tenderness  Lungs:     Clear to auscultation bilaterally, respirations unlabored  Chest Wall:    No tenderness or deformity   Heart:    Regular rate and rhythm, S1 and S2 normal, no  murmur, rub   or gallop  Breast Exam:    No tenderness, masses, or nipple abnormality  Abdomen:     Soft, non-tender, bowel sounds active all four quadrants,    no masses, no organomegaly  Genitalia:    Normal female without lesion, discharge or tenderness Using a small yellow speculum performed speculum exam and pap smear Also performed bimanual exam and there were no palpable masses, no ovarian masses  Extremities:   Extremities normal, atraumatic, no cyanosis or edema  Pulses:   2+ and symmetric all extremities  Skin:   Skin color, texture, turgor normal, no rashes or lesions  Lymph nodes:   Cervical, supraclavicular, and axillary nodes normal  Neurologic:   CNII-XII intact, normal strength, sensation and reflexes    throughout      Assessment/Plan:   Patient was seen for a health maintenance exam.  Counseled the patient on health maintenance issues. Reviewed her health mainteance schedule and ordered appropriate tests (see orders.) Counseled on regular exercise and weight management. Recommend regular eye exams and dental cleaning.   The following issues were addressed today for health maintenance:   Bianca was seen today for annual exam.  Diagnoses and all orders for this visit:  Annual physical exam- Women's Health Maintenance Plan Advised monthly breast exam and annual mammogram Advised dental exam every six months Discussed stress management Discussed pap smear screening guidelines Normal BMI - maintain healthy weight  Essential hypertension- bp well controlled,  will check renal function and lipids, will refill meds  -     Lipid panel -     CMP14+EGFR -     POCT urinalysis dipstick  Decreased thyroid stimulating hormone (TSH) level -     TSH  Screening for colon cancer -     Ambulatory referral to Gastroenterology  Screening for cervical cancer- Discussed cervical cancer screening  As well as screening for HPV Discussed risk factors for cervical cancer  And hpv vaccination records reviewed  -     Cytology - PAP -     Human papillomavirus (HPV) high risk ([place])  Other orders -     losartan-hydrochlorothiazide (HYZAAR) 50-12.5 MG tablet; Take 0.5 tablets by mouth daily.    Return in about 1 year (around 03/19/2020) for physical.    Body mass index is 22.55 kg/m.:  Discussed the patient's BMI with patient. The BMI body mass index is 22.55 kg/m.     No future appointments.  Patient Instructions       If you have lab work done today you will be contacted with your lab results within the next 2 weeks.  If you have not heard from [place] then please contact us. The fastest way to get your results is to register for My Chart.   IF you received an x-ray today, you will receive an invoice from [facility]. Please contact [facility] at [phone] with questions or concerns regarding your invoice.   IF you received labwork today, you will receive an invoice from [place]. Please contact [facility] at [phone] with questions or concerns regarding your invoice.   Our [facility] staff will not be able to assist you with questions regarding bills from these companies.  You will be contacted with the lab results as soon as they are available. The fastest way to get your results is to activate your My Chart account. Instructions are located on the last page of this paperwork. If you have not heard from [place] regarding the results in  2 weeks, please contact this office.     Health Maintenance for Postmenopausal Women  Menopause is a normal process in which your reproductive ability comes to an end. This process happens gradually over a span of months to years, usually between the ages of 5 and 41. Menopause is complete when you have missed 12 consecutive menstrual periods. It is important to talk with your health care provider about some of the most common conditions that affect postmenopausal women, such as heart disease, cancer, and bone loss (osteoporosis). Adopting a healthy lifestyle and getting preventive care can help to promote your health and wellness. Those actions can also lower your chances of developing some of these common conditions. What should I know about menopause? During menopause, you may experience a number of symptoms, such as:  Moderate-to-severe hot flashes.  Night sweats.  Decrease in sex drive.  Mood swings.  Headaches.  Tiredness.  Irritability.  Memory problems.  Insomnia. Choosing to treat or not to treat menopausal changes is an individual decision that you make with your health care provider. What should I know about hormone replacement therapy and supplements? Hormone therapy products are effective for treating symptoms that are associated with menopause, such as hot flashes and night sweats. Hormone replacement carries certain risks, especially as you become older. If you are thinking about using estrogen or estrogen with progestin treatments, discuss the benefits and risks with your health care provider. What should I know about heart disease and stroke? Heart disease, heart attack, and stroke become more likely as you age. This may be due, in part, to the hormonal changes that your body experiences during menopause. These can affect how your body processes dietary fats, triglycerides, and cholesterol. Heart attack and stroke are both medical emergencies. There are many things that you can do to help prevent heart disease and stroke:  Have your blood pressure  checked at least every 1-2 years. High blood pressure causes heart disease and increases the risk of stroke.  If you are 62-59 years old, ask your health care provider if you should take aspirin to prevent a heart attack or a stroke.  Do not use any tobacco products, including cigarettes, chewing tobacco, or electronic cigarettes. If you need help quitting, ask your health care provider.  It is important to eat a healthy diet and maintain a healthy weight. ? Be sure to include plenty of vegetables, fruits, low-fat dairy products, and lean protein. ? Avoid eating foods that are high in solid fats, added sugars, or salt (sodium).  Get regular exercise. This is one of the most important things that you can do for your health. ? Try to exercise for at least 150 minutes each week. The type of exercise that you do should increase your heart rate and make you sweat. This is known as moderate-intensity exercise. ? Try to do strengthening exercises at least twice each week. Do these in addition to the moderate-intensity exercise.  Know your numbers.Ask your health care provider to check your cholesterol and your blood glucose. Continue to have your blood tested as directed by your health care provider.  What should I know about cancer screening? There are several types of cancer. Take the following steps to reduce your risk and to catch any cancer development as early as possible. Breast Cancer  Practice breast self-awareness. ? This means understanding how your breasts normally appear and feel. ? It also means doing regular breast self-exams. Let your health care provider know about any  changes, no matter how small.  If you are 48 or older, have a clinician do a breast exam (clinical breast exam or CBE) every year. Depending on your age, family history, and medical history, it may be recommended that you also have a yearly breast X-ray (mammogram).  If you have a family history of breast cancer,  talk with your health care provider about genetic screening.  If you are at high risk for breast cancer, talk with your health care provider about having an MRI and a mammogram every year.  Breast cancer (BRCA) gene test is recommended for women who have family members with BRCA-related cancers. Results of the assessment will determine the need for genetic counseling and BRCA1 and for BRCA2 testing. BRCA-related cancers include these types: ? Breast. This occurs in males or females. ? Ovarian. ? Tubal. This may also be called fallopian tube cancer. ? Cancer of the abdominal or pelvic lining (peritoneal cancer). ? Prostate. ? Pancreatic. Cervical, Uterine, and Ovarian Cancer Your health care provider may recommend that you be screened regularly for cancer of the pelvic organs. These include your ovaries, uterus, and vagina. This screening involves a pelvic exam, which includes checking for microscopic changes to the surface of your cervix (Pap test).  For women ages 21-65, health care providers may recommend a pelvic exam and a Pap test every three years. For women ages 69-65, they may recommend the Pap test and pelvic exam, combined with testing for human papilloma virus (HPV), every five years. Some types of HPV increase your risk of cervical cancer. Testing for HPV may also be done on women of any age who have unclear Pap test results.  Other health care providers may not recommend any screening for nonpregnant women who are considered low risk for pelvic cancer and have no symptoms. Ask your health care provider if a screening pelvic exam is right for you.  If you have had past treatment for cervical cancer or a condition that could lead to cancer, you need Pap tests and screening for cancer for at least 20 years after your treatment. If Pap tests have been discontinued for you, your risk factors (such as having a new sexual partner) need to be reassessed to determine if you should start having  screenings again. Some women have medical problems that increase the chance of getting cervical cancer. In these cases, your health care provider may recommend that you have screening and Pap tests more often.  If you have a family history of uterine cancer or ovarian cancer, talk with your health care provider about genetic screening.  If you have vaginal bleeding after reaching menopause, tell your health care provider.  There are currently no reliable tests available to screen for ovarian cancer. Lung Cancer Lung cancer screening is recommended for adults 87-29 years old who are at high risk for lung cancer because of a history of smoking. A yearly low-dose CT scan of the lungs is recommended if you:  Currently smoke.  Have a history of at least 30 pack-years of smoking and you currently smoke or have quit within the past 15 years. A pack-year is smoking an average of one pack of cigarettes per day for one year. Yearly screening should:  Continue until it has been 15 years since you quit.  Stop if you develop a health problem that would prevent you from having lung cancer treatment. Colorectal Cancer  This type of cancer can be detected and can often be prevented.  Routine  colorectal cancer screening usually begins at age 81 and continues through age 72.  If you have risk factors for colon cancer, your health care provider may recommend that you be screened at an earlier age.  If you have a family history of colorectal cancer, talk with your health care provider about genetic screening.  Your health care provider may also recommend using home test kits to check for hidden blood in your stool.  A small camera at the end of a tube can be used to examine your colon directly (sigmoidoscopy or colonoscopy). This is done to check for the earliest forms of colorectal cancer.  Direct examination of the colon should be repeated every 5-10 years until age 57. However, if early forms of  precancerous polyps or small growths are found or if you have a family history or genetic risk for colorectal cancer, you may need to be screened more often. Skin Cancer  Check your skin from head to toe regularly.  Monitor any moles. Be sure to tell your health care provider: ? About any new moles or changes in moles, especially if there is a change in a mole's shape or color. ? If you have a mole that is larger than the size of a pencil eraser.  If any of your family members has a history of skin cancer, especially at a young age, talk with your health care provider about genetic screening.  Always use sunscreen. Apply sunscreen liberally and repeatedly throughout the day.  Whenever you are outside, protect yourself by wearing long sleeves, pants, a wide-brimmed hat, and sunglasses. What should I know about osteoporosis? Osteoporosis is a condition in which bone destruction happens more quickly than new bone creation. After menopause, you may be at an increased risk for osteoporosis. To help prevent osteoporosis or the bone fractures that can happen because of osteoporosis, the following is recommended:  If you are 52-31 years old, get at least 1,000 mg of calcium and at least 600 mg of vitamin D per day.  If you are older than age 44 but younger than age 68, get at least 1,200 mg of calcium and at least 600 mg of vitamin D per day.  If you are older than age 19, get at least 1,200 mg of calcium and at least 800 mg of vitamin D per day. Smoking and excessive alcohol intake increase the risk of osteoporosis. Eat foods that are rich in calcium and vitamin D, and do weight-bearing exercises several times each week as directed by your health care provider. What should I know about how menopause affects my mental health? Depression may occur at any age, but it is more common as you become older. Common symptoms of depression include:  Low or sad mood.  Changes in sleep patterns.  Changes  in appetite or eating patterns.  Feeling an overall lack of motivation or enjoyment of activities that you previously enjoyed.  Frequent crying spells. Talk with your health care provider if you think that you are experiencing depression. What should I know about immunizations? It is important that you get and maintain your immunizations. These include:  Tetanus, diphtheria, and pertussis (Tdap) booster vaccine.  Influenza every year before the flu season begins.  Pneumonia vaccine.  Shingles vaccine. Your health care provider may also recommend other immunizations. This information is not intended to replace advice given to you by your health care provider. Make sure you discuss any questions you have with your health care provider. Document Released:  11/09/2005 Document Revised: 04/06/2016 Document Reviewed: 06/21/2015 Elsevier Interactive Patient Education  [employer].

## 2019-03-20 NOTE — Patient Instructions (Addendum)
If you have lab work done today you will be contacted with your lab results within the next 2 weeks.  If you have not heard from [place] then please contact us. The fastest way to get your results is to register for My Chart.   IF you received an x-ray today, you will receive an invoice from [facility]. Please contact [facility] at [phone] with questions or concerns regarding your invoice.   IF you received labwork today, you will receive an invoice from [facility]. Please contact [facility] at [phone] with questions or concerns regarding your invoice.   Our billing staff will not be able to assist you with questions regarding bills from these companies.  You will be contacted with the lab results as soon as they are available. The fastest way to get your results is to activate your My Chart account. Instructions are located on the last page of this paperwork. If you have not heard from [place] regarding the results in 2 weeks, please contact this office.     Health Maintenance for Postmenopausal Women Menopause is a normal process in which your reproductive ability comes to an end. This process happens gradually over a span of months to years, usually between the ages of 45 and 17. Menopause is complete when you have missed 12 consecutive menstrual periods. It is important to talk with your health care provider about some of the most common conditions that affect postmenopausal women, such as heart disease, cancer, and bone loss (osteoporosis). Adopting a healthy lifestyle and getting preventive care can help to promote your health and wellness. Those actions can also lower your chances of developing some of these common conditions. What should I know about menopause? During menopause, you may experience a number of symptoms, such as:  Moderate-to-severe hot flashes.  Night sweats.  Decrease in sex drive.  Mood  swings.  Headaches.  Tiredness.  Irritability.  Memory problems.  Insomnia. Choosing to treat or not to treat menopausal changes is an individual decision that you make with your health care provider. What should I know about hormone replacement therapy and supplements? Hormone therapy products are effective for treating symptoms that are associated with menopause, such as hot flashes and night sweats. Hormone replacement carries certain risks, especially as you become older. If you are thinking about using estrogen or estrogen with progestin treatments, discuss the benefits and risks with your health care provider. What should I know about heart disease and stroke? Heart disease, heart attack, and stroke become more likely as you age. This may be due, in part, to the hormonal changes that your body experiences during menopause. These can affect how your body processes dietary fats, triglycerides, and cholesterol. Heart attack and stroke are both medical emergencies. There are many things that you can do to help prevent heart disease and stroke:  Have your blood pressure checked at least every 1-2 years. High blood pressure causes heart disease and increases the risk of stroke.  If you are 60-60 years old, ask your health care provider if you should take aspirin to prevent a heart attack or a stroke.  Do not use any tobacco products, including cigarettes, chewing tobacco, or electronic cigarettes. If you need help quitting, ask your health care provider.  It is important to eat a healthy diet and maintain a healthy weight. ? Be sure to include plenty of vegetables, fruits, low-fat dairy products, and lean protein. ? Avoid eating foods that are high in solid  fats, added sugars, or salt (sodium).  Get regular exercise. This is one of the most important things that you can do for your health. ? Try to exercise for at least 150 minutes each week. The type of exercise that you do should  increase your heart rate and make you sweat. This is known as moderate-intensity exercise. ? Try to do strengthening exercises at least twice each week. Do these in addition to the moderate-intensity exercise.  Know your numbers.Ask your health care provider to check your cholesterol and your blood glucose. Continue to have your blood tested as directed by your health care provider.  What should I know about cancer screening? There are several types of cancer. Take the following steps to reduce your risk and to catch any cancer development as early as possible. Breast Cancer  Practice breast self-awareness. ? This means understanding how your breasts normally appear and feel. ? It also means doing regular breast self-exams. Let your health care provider know about any changes, no matter how small.  If you are 40 or older, have a clinician do a breast exam (clinical breast exam or CBE) every year. Depending on your age, family history, and medical history, it may be recommended that you also have a yearly breast X-ray (mammogram).  If you have a family history of breast cancer, talk with your health care provider about genetic screening.  If you are at high risk for breast cancer, talk with your health care provider about having an MRI and a mammogram every year.  Breast cancer (BRCA) gene test is recommended for women who have family members with BRCA-related cancers. Results of the assessment will determine the need for genetic counseling and BRCA1 and for BRCA2 testing. BRCA-related cancers include these types: ? Breast. This occurs in males or females. ? Ovarian. ? Tubal. This may also be called fallopian tube cancer. ? Cancer of the abdominal or pelvic lining (peritoneal cancer). ? Prostate. ? Pancreatic. Cervical, Uterine, and Ovarian Cancer Your health care provider may recommend that you be screened regularly for cancer of the pelvic organs. These include your ovaries, uterus, and  vagina. This screening involves a pelvic exam, which includes checking for microscopic changes to the surface of your cervix (Pap test).  For women ages 21-65, health care providers may recommend a pelvic exam and a Pap test every three years. For women ages 30-65, they may recommend the Pap test and pelvic exam, combined with testing for human papilloma virus (HPV), every five years. Some types of HPV increase your risk of cervical cancer. Testing for HPV may also be done on women of any age who have unclear Pap test results.  Other health care providers may not recommend any screening for nonpregnant women who are considered low risk for pelvic cancer and have no symptoms. Ask your health care provider if a screening pelvic exam is right for you.  If you have had past treatment for cervical cancer or a condition that could lead to cancer, you need Pap tests and screening for cancer for at least 20 years after your treatment. If Pap tests have been discontinued for you, your risk factors (such as having a new sexual partner) need to be reassessed to determine if you should start having screenings again. Some women have medical problems that increase the chance of getting cervical cancer. In these cases, your health care provider may recommend that you have screening and Pap tests more often.  If you have a family   history of uterine cancer or ovarian cancer, talk with your health care provider about genetic screening.  If you have vaginal bleeding after reaching menopause, tell your health care provider.  There are currently no reliable tests available to screen for ovarian cancer. Lung Cancer Lung cancer screening is recommended for adults 55-80 years old who are at high risk for lung cancer because of a history of smoking. A yearly low-dose CT scan of the lungs is recommended if you:  Currently smoke.  Have a history of at least 30 pack-years of smoking and you currently smoke or have quit within  the past 15 years. A pack-year is smoking an average of one pack of cigarettes per day for one year. Yearly screening should:  Continue until it has been 15 years since you quit.  Stop if you develop a health problem that would prevent you from having lung cancer treatment. Colorectal Cancer  This type of cancer can be detected and can often be prevented.  Routine colorectal cancer screening usually begins at age 50 and continues through age 75.  If you have risk factors for colon cancer, your health care provider may recommend that you be screened at an earlier age.  If you have a family history of colorectal cancer, talk with your health care provider about genetic screening.  Your health care provider may also recommend using home test kits to check for hidden blood in your stool.  A small camera at the end of a tube can be used to examine your colon directly (sigmoidoscopy or colonoscopy). This is done to check for the earliest forms of colorectal cancer.  Direct examination of the colon should be repeated every 5-10 years until age 75. However, if early forms of precancerous polyps or small growths are found or if you have a family history or genetic risk for colorectal cancer, you may need to be screened more often. Skin Cancer  Check your skin from head to toe regularly.  Monitor any moles. Be sure to tell your health care provider: ? About any new moles or changes in moles, especially if there is a change in a mole's shape or color. ? If you have a mole that is larger than the size of a pencil eraser.  If any of your family members has a history of skin cancer, especially at a young age, talk with your health care provider about genetic screening.  Always use sunscreen. Apply sunscreen liberally and repeatedly throughout the day.  Whenever you are outside, protect yourself by wearing long sleeves, pants, a wide-brimmed hat, and sunglasses. What should I know about  osteoporosis? Osteoporosis is a condition in which bone destruction happens more quickly than new bone creation. After menopause, you may be at an increased risk for osteoporosis. To help prevent osteoporosis or the bone fractures that can happen because of osteoporosis, the following is recommended:  If you are 19-50 years old, get at least 1,000 mg of calcium and at least 600 mg of vitamin D per day.  If you are older than age 50 but younger than age 70, get at least 1,200 mg of calcium and at least 600 mg of vitamin D per day.  If you are older than age 70, get at least 1,200 mg of calcium and at least 800 mg of vitamin D per day. Smoking and excessive alcohol intake increase the risk of osteoporosis. Eat foods that are rich in calcium and vitamin D, and do weight-bearing exercises several times   each week as directed by your health care provider. What should I know about how menopause affects my mental health? Depression may occur at any age, but it is more common as you become older. Common symptoms of depression include:  Low or sad mood.  Changes in sleep patterns.  Changes in appetite or eating patterns.  Feeling an overall lack of motivation or enjoyment of activities that you previously enjoyed.  Frequent crying spells. Talk with your health care provider if you think that you are experiencing depression. What should I know about immunizations? It is important that you get and maintain your immunizations. These include:  Tetanus, diphtheria, and pertussis (Tdap) booster vaccine.  Influenza every year before the flu season begins.  Pneumonia vaccine.  Shingles vaccine. Your health care provider may also recommend other immunizations. This information is not intended to replace advice given to you by your health care provider. Make sure you discuss any questions you have with your health care provider. Document Released: 11/09/2005 Document Revised: 04/06/2016 Document  Reviewed: 06/21/2015 Elsevier Interactive Patient Education  2019 [employer].

## 2019-03-21 LAB — CMP14+EGFR
ALT: 21 IU/L (ref 0–32)
AST: 17 IU/L (ref 0–40)
Albumin/Globulin Ratio: 1.9 (ref 1.2–2.2)
Albumin: 4.5 g/dL (ref 3.8–4.9)
Alkaline Phosphatase: 100 IU/L (ref 39–117)
BUN/Creatinine Ratio: 13 (ref 12–28)
BUN: 14 mg/dL (ref 8–27)
Bilirubin Total: 0.5 mg/dL (ref 0.0–1.2)
CO2: 25 mmol/L (ref 20–29)
Calcium: 9.9 mg/dL (ref 8.7–10.3)
Chloride: 103 mmol/L (ref 96–106)
Creatinine, Ser: 1.05 mg/dL — ABNORMAL HIGH (ref 0.57–1.00)
GFR calc Af Amer: 67 mL/min/{1.73_m2} (ref >59)
GFR calc non Af Amer: 58 mL/min/{1.73_m2} — ABNORMAL LOW (ref >59)
Globulin, Total: 2.4 g/dL (ref 1.5–4.5)
Glucose: 87 mg/dL (ref 65–99)
Potassium: 4.1 mmol/L (ref 3.5–5.2)
Sodium: 141 mmol/L (ref 134–144)
Total Protein: 6.9 g/dL (ref 6.0–8.5)

## 2019-03-21 LAB — LIPID PANEL
Chol/HDL Ratio: 3.4 ratio (ref 0.0–4.4)
Cholesterol, Total: 230 mg/dL — ABNORMAL HIGH (ref 100–199)
HDL: 67 mg/dL (ref >39)
LDL Calculated: 138 mg/dL — ABNORMAL HIGH (ref 0–99)
Triglycerides: 123 mg/dL (ref 0–149)
VLDL Cholesterol Cal: 25 mg/dL (ref 5–40)

## 2019-03-21 LAB — TSH: TSH: 0.45 u[IU]/mL (ref 0.450–4.500)

## 2019-03-24 NOTE — Telephone Encounter (Signed)
E-visit scheduled on 6/26 at 4:00pm. To discuss repeat colon.

## 2019-03-25 LAB — CYTOLOGY - PAP
Diagnosis: NEGATIVE
HPV: NOT DETECTED

## 2019-03-26 ENCOUNTER — Encounter: Payer: Self-pay | Admitting: Family Medicine

## 2019-03-27 ENCOUNTER — Other Ambulatory Visit: Payer: Self-pay

## 2019-03-27 ENCOUNTER — Encounter: Payer: Self-pay | Admitting: Gastroenterology

## 2019-03-27 ENCOUNTER — Telehealth (INDEPENDENT_AMBULATORY_CARE_PROVIDER_SITE_OTHER): Payer: 59 | Admitting: Gastroenterology

## 2019-03-27 VITALS — Ht 64.0 in | Wt 131.0 lb

## 2019-03-27 DIAGNOSIS — Z86010 Personal history of colonic polyps: Secondary | ICD-10-CM | POA: Diagnosis not present

## 2019-03-27 NOTE — Patient Instructions (Signed)
If you are age 60 or older, your body mass index should be between 23-30. Your Body mass index is 22.49 kg/m. If this is out of the aforementioned range listed, please consider follow up with your Primary Care Provider.  If you are age 38 or younger, your body mass index should be between 19-25. Your Body mass index is 22.49 kg/m. If this is out of the aformentioned range listed, please consider follow up with your Primary Care Provider.   You have been scheduled for a colonoscopy. Please follow written instructions given to you at your visit today.  Please pick up your prep supplies at the pharmacy within the next 1-3 days. If you use inhalers (even only as needed), please bring them with you on the day of your procedure. Your physician has requested that you go to www.startemmi.com and enter the access code given to you at your visit today. This web site gives a general overview about your procedure. However, you should still follow specific instructions given to you by our office regarding your preparation for the procedure.'  It was a pleasure to see you today!  Vito Cirigliano, D.O.

## 2019-03-27 NOTE — Progress Notes (Signed)
Chief Complaint: Polyp surveillance  Referring Provider:     Self   HPI:    Due to current restrictions/limitations of in-office visits due to the COVID-19 pandemic, this scheduled clinical appointment was converted to a telehealth virtual consultation using D[employer].  -Time: 16 minutes -The patient did consent to this virtual visit and is aware of possible charges through their insurance for this visit.  -Names of all parties present: Haley Vega (patient), Gerrit Heck, DO, [facility] (physician) -Patient location: Home -Physician location: Office  Haley Vega is a 58 y.o. female referred to the Gastroenterology Clinic for evaluation of ongoing CRC screening.  Last colonoscopy was in 2015 at [facility] and was normal, but given recommendation to repeat in 5 years due to personal history of adenomatous polyps.  Had adenomatous polyps in 2009 at a separate facility.   She states she is otherwise without any active GI symptoms.  She denies nausea, vomiting, diarrhea, constipation, hematochezia, melena, night sweats, fever, chills, weight loss, early satiety, dysphagia, odynophagia.  She o/w denies any known family history of CRC or GI malignancy.  Past medical history, past surgical history, social history, family history, medications, and allergies reviewed in the chart and with patient.    Past Medical History:  Diagnosis Date  . Hypercholesteremia   . Hypertension      Past Surgical History:  Procedure Laterality Date  . TONSILLECTOMY    . TUBAL LIGATION     Family History  Problem Relation Age of Onset  . Colon cancer Neg Hx    Social History   Tobacco Use  . Smoking status: Never Smoker  . Smokeless tobacco: Never Used  Substance Use Topics  . Alcohol use: Yes    Alcohol/week: 0.0 standard drinks    Comment: social 1 drink  . Drug use: No   Current Outpatient Medications  Medication Sig Dispense Refill  . aspirin EC 81 MG tablet  Take by mouth.    . Coenzyme Q10 (COQ-10) 100 MG CAPS Take 1 capsule by mouth daily.    Marland Kitchen losartan-hydrochlorothiazide (HYZAAR) 50-12.5 MG tablet Take 0.5 tablets by mouth daily. 45 tablet 3  . Omega-3 Fatty Acids (FISH OIL) 1200 MG CAPS Take 1,200 mg by mouth daily.     No current facility-administered medications for this visit.    Allergies  Allergen Reactions  . Influenza Vaccines Swelling    Had significant swelling of face, lips and eyelids     Review of Systems: All systems reviewed and negative except where noted in HPI.     Physical Exam:    Complete physical exam not completed due to the nature of this telehealth communication.   Gen: Awake, alert, and oriented, and well communicative. HEENT: EOMI, non-icteric sclera, NCAT, MMM Neck: Normal movement of head and neck Pulm: No labored breathing, speaking in full sentences without conversational dyspnea Derm: No apparent lesions or bruising in visible field MS: Moves all visible extremities without noticeable abnormality Psych: Pleasant, cooperative, normal speech, thought processing seemingly intact   ASSESSMENT AND PLAN;   1) History of colon polyps/Polyp surveillance: - History of adenomatous polyps in 2009, with repeat colonoscopy 2015 that was normal, with recommendation repeat in 5 years for ongoing polyp surveillance.  Based on current societal guidelines in conjunction with her previous recommendations, my recommendation was for repeat colonoscopy now, and if again normal, think it would be reasonable to change ongoing interval to 10  years.  If polyps, would make interval recommendation based on polyp histology, size, location, number, etc.  She understands and agrees with this plan. -Schedule for colonoscopy  The indications, risks, and benefits of colonoscopy were explained to the patient in detail. Risks include but are not limited to bleeding, perforation, adverse reaction to medications, and cardiopulmonary  compromise. Sequelae include but are not limited to the possibility of surgery, hospitalization, and mortality. The patient verbalized understanding and wished to proceed. All questions answered, referred to the scheduler and bowel prep ordered. Further recommendations pending results of the exam.    Dominic Pea Willeen Novak, DO, FACG  03/27/2019, 4:13 PM   No ref. provider found

## 2019-03-30 MED ORDER — CLENPIQ 10-3.5-12 MG-GM -GM/160ML PO SOLN: 1.0000 | ORAL | 0 refills | Status: DC

## 2019-03-30 MED FILL — CLENPIQ 10-3.5-12 MG-GM -GM: 10-3.5-12 M | 1 days supply | Qty: 320 | Fill #0

## 2019-03-30 NOTE — Telephone Encounter (Signed)
Pt stated that she is returning your call.  °

## 2019-04-08 ENCOUNTER — Encounter: Payer: Self-pay | Admitting: Gastroenterology

## 2019-04-09 ENCOUNTER — Telehealth: Payer: Self-pay | Admitting: Gastroenterology

## 2019-04-09 NOTE — Telephone Encounter (Signed)

## 2019-04-10 ENCOUNTER — Telehealth: Payer: Self-pay | Admitting: Gastroenterology

## 2019-04-10 ENCOUNTER — Ambulatory Visit (AMBULATORY_SURGERY_CENTER): Payer: 59 | Admitting: Gastroenterology

## 2019-04-10 ENCOUNTER — Other Ambulatory Visit: Payer: Self-pay

## 2019-04-10 ENCOUNTER — Encounter: Payer: Self-pay | Admitting: Gastroenterology

## 2019-04-10 VITALS — BP 130/82 | HR 72 | Temp 98.8°F | Resp 10 | Ht 64.0 in | Wt 131.0 lb

## 2019-04-10 DIAGNOSIS — D120 Benign neoplasm of cecum: Secondary | ICD-10-CM

## 2019-04-10 DIAGNOSIS — Z86010 Personal history of colonic polyps: Secondary | ICD-10-CM

## 2019-04-10 DIAGNOSIS — Z1211 Encounter for screening for malignant neoplasm of colon: Secondary | ICD-10-CM

## 2019-04-10 DIAGNOSIS — D123 Benign neoplasm of transverse colon: Secondary | ICD-10-CM | POA: Diagnosis not present

## 2019-04-10 MED ORDER — SODIUM CHLORIDE 0.9 % IV SOLN: 500.0000 mL | Freq: Once | INTRAVENOUS | Status: DC

## 2019-04-10 NOTE — Progress Notes (Signed)
Called to room to assist during endoscopic procedure.  Patient ID and intended procedure confirmed with present staff. Received instructions for my participation in the procedure from the performing physician.  

## 2019-04-10 NOTE — Op Note (Signed)
[facility] Patient Name: Haley Vega Procedure Date: 04/10/2019 11:30 AM MRN: [id number] Endoscopist: Gerrit Heck , MD Age: 58 Referring MD:  Date of Birth: 1961/05/12 Gender: Female Account #: [id number] Procedure:                Colonoscopy Indications:              High risk colon cancer surveillance: Personal                            history of colonic polyps. Last colonoscopy was in                            2015 at outside facility and normal, with                            recommendation to repeat in 5 years due to                            adenomatous polyp on the prior colonsocopy in 2009                            (also at outside facility). Otherwise, no active GI                            sxs. Medicines:                Monitored Anesthesia Care Procedure:                Pre-Anesthesia Assessment:                           - Prior to the procedure, a History and Physical                            was performed, and patient medications and                            allergies were reviewed. The patient's tolerance of                            previous anesthesia was also reviewed. The risks                            and benefits of the procedure and the sedation                            options and risks were discussed with the patient.                            All questions were answered, and informed consent                            was obtained. Prior Anticoagulants: The patient has  taken no previous anticoagulant or antiplatelet                            agents. ASA Grade Assessment: II - A patient with                            mild systemic disease. After reviewing the risks                            and benefits, the patient was deemed in                            satisfactory condition to undergo the procedure.                           After obtaining informed consent, the colonoscope        was passed under direct vision. Throughout the                            procedure, the patient's blood pressure, pulse, and                            oxygen saturations were monitored continuously. The                            Colonoscope was introduced through the anus and                            advanced to the the cecum, identified by                            appendiceal orifice and ileocecal valve. The                            colonoscopy was performed without difficulty. The                            patient tolerated the procedure well. The quality                            of the bowel preparation was adequate. The                            ileocecal valve, appendiceal orifice, and rectum                            were photographed. Scope In: 11:33:44 AM Scope Out: 11:52:04 AM Scope Withdrawal Time: 0 hours 13 minutes 13 seconds  Total Procedure Duration: 0 hours 18 minutes 20 seconds  Findings:                 The perianal and digital rectal examinations were                            normal.  A 2 mm polyp was found in the cecum. The polyp was                            sessile. The polyp was removed with a cold biopsy                            forceps. Resection and retrieval were complete.                            Estimated blood loss was minimal.                           A 4 mm polyp was found in the transverse colon. The                            polyp was sessile. The polyp was removed with a                            cold snare. Resection and retrieval were complete.                            Estimated blood loss was minimal.                           The retroflexed view of the distal rectum and anal                            verge was normal and showed no anal or rectal                            abnormalities. Complications:            No immediate complications. Estimated Blood Loss:     Estimated blood loss was  minimal. Impression:               - One 2 mm polyp in the cecum, removed with a cold                            biopsy forceps. Resected and retrieved.                           - One 4 mm polyp in the transverse colon, removed                            with a cold snare. Resected and retrieved.                           - The distal rectum and anal verge are normal on                            retroflexion view. Recommendation:           - Patient has a contact number available for  emergencies. The signs and symptoms of potential                            delayed complications were discussed with the                            patient. Return to normal activities tomorrow.                            Written discharge instructions were provided to the                            patient.                           - Resume previous diet.                           - Continue present medications.                           - Await pathology results.                           - Repeat colonoscopy in 7-10 years for surveillance                            based on pathology results.                           - Return to GI clinic PRN. Gerrit Heck, MD 04/10/2019 12:02:20 PM

## 2019-04-10 NOTE — Patient Instructions (Signed)
Impression/Recommendations:  Polyp handout given to patient.  Resume previous diet. Continue present medications.   Await pathology results.  Repeat colonoscopy in 7-10 years for surveillance based on pathology results.  Return to GI clinic PRN.  YOU HAD AN ENDOSCOPIC PROCEDURE TODAY AT [facility]:   Refer to the procedure report that was given to you for any specific questions about what was found during the examination.  If the procedure report does not answer your questions, please call your gastroenterologist to clarify.  If you requested that your care partner not be given the details of your procedure findings, then the procedure report has been included in a sealed envelope for you to review at your convenience later.  YOU SHOULD EXPECT: Some feelings of bloating in the abdomen. Passage of more gas than usual.  Walking can help get rid of the air that was put into your GI tract during the procedure and reduce the bloating. If you had a lower endoscopy (such as a colonoscopy or flexible sigmoidoscopy) you may notice spotting of blood in your stool or on the toilet paper. If you underwent a bowel prep for your procedure, you may not have a normal bowel movement for a few days.  Please Note:  You might notice some irritation and congestion in your nose or some drainage.  This is from the oxygen used during your procedure.  There is no need for concern and it should clear up in a day or so.  SYMPTOMS TO REPORT IMMEDIATELY:   Following lower endoscopy (colonoscopy or flexible sigmoidoscopy):  Excessive amounts of blood in the stool  Significant tenderness or worsening of abdominal pains  Swelling of the abdomen that is new, acute  Fever of 100F or higher For urgent or emergent issues, a gastroenterologist can be reached at any hour by calling [phone].   DIET:  We do recommend a small meal at first, but then you may proceed to your regular diet.  Drink plenty  of fluids but you should avoid alcoholic beverages for 24 hours.  ACTIVITY:  You should plan to take it easy for the rest of today and you should NOT DRIVE or use heavy machinery until tomorrow (because of the sedation medicines used during the test).    FOLLOW UP: Our staff will call the number listed on your records 48-72 hours following your procedure to check on you and address any questions or concerns that you may have regarding the information given to you following your procedure. If we do not reach you, we will leave a message.  We will attempt to reach you two times.  During this call, we will ask if you have developed any symptoms of COVID 19. If you develop any symptoms (ie: fever, flu-like symptoms, shortness of breath, cough etc.) before then, please call [phone].  If you test positive for Covid 19 in the 2 weeks post procedure, please call and report this information to [place].    If any biopsies were taken you will be contacted by phone or by letter within the next 1-3 weeks.  Please call us at [phone] if you have not heard about the biopsies in 3 weeks.    SIGNATURES/CONFIDENTIALITY: You and/or your care partner have signed paperwork which will be entered into your electronic medical record.  These signatures attest to the fact that that the information above on your After Visit Summary has been reviewed and is understood.  Full responsibility of the confidentiality of  this discharge information lies with you and/or your care-partner.

## 2019-04-10 NOTE — Progress Notes (Signed)
A/ox3, pleased with MAC, report to RN 

## 2019-04-10 NOTE — Telephone Encounter (Signed)
Patient called around 1215 last night. First portion of Clenpiq prep produced one small bowel movement and nothing else. She is concerned she will not be ready for colonoscopy tomorrow. She did not care for the Clenpiq. I instructed her on how to do a MIralax bowel prep, she will go to the pharmacy to get supplies and drink 1/2 prep now, and second half early AM. She can also take the second half of Clenpiq if needed despite the full Miralax prep. If she is not adequately prepped by 8 AM I asked her to call the office. She took the day off from work and is willing to drink more prep this AM if needed.   [facility]

## 2019-04-10 NOTE — Progress Notes (Signed)
Temperature taken by Nancy Campbell, LPN, VS taken by Courtney Washington, CMA 

## 2019-04-14 ENCOUNTER — Telehealth: Payer: Self-pay

## 2019-04-14 NOTE — Telephone Encounter (Signed)
  Follow up Call-  Call back number 04/10/2019  Post procedure Call Back phone  # [phone]  Permission to leave phone message Yes  Some recent data might be hidden     Patient questions:  Do you have a fever, pain , or abdominal swelling? No. Pain Score  0 *  Have you tolerated food without any problems? Yes.    Have you been able to return to your normal activities? Yes.    Do you have any questions about your discharge instructions: Diet   No. Medications  No. Follow up visit  No.  Do you have questions or concerns about your Care? No.  Actions: * If pain score is 4 or above: No action needed, pain <4.  1. Have you developed a fever since your procedure? no  2.   Have you had an respiratory symptoms (SOB or cough) since your procedure? no  3.   Have you tested positive for COVID 19 since your procedure no  4.   Have you had any family members/close contacts diagnosed with the COVID 19 since your procedure?  no   If yes to any of these questions please route to Joylene John, RN and Alphonsa Gin, Therapist, sports.

## 2019-04-16 ENCOUNTER — Encounter: Payer: Self-pay | Admitting: Gastroenterology

## 2019-04-20 ENCOUNTER — Encounter: Payer: 59 | Admitting: Family Medicine

## 2019-05-05 ENCOUNTER — Other Ambulatory Visit: Payer: Self-pay | Admitting: Family Medicine

## 2019-05-05 DIAGNOSIS — Z1231 Encounter for screening mammogram for malignant neoplasm of breast: Secondary | ICD-10-CM

## 2019-05-06 ENCOUNTER — Other Ambulatory Visit: Payer: Self-pay

## 2019-05-06 ENCOUNTER — Ambulatory Visit
Admission: RE | Admit: 2019-05-06 | Discharge: 2019-05-06 | Disposition: A | Discharge status: Home / Self Care | Payer: 59 | Source: Ambulatory Visit

## 2019-05-06 DIAGNOSIS — Z1231 Encounter for screening mammogram for malignant neoplasm of breast: Secondary | ICD-10-CM

## 2019-08-09 ENCOUNTER — Other Ambulatory Visit: Payer: Self-pay

## 2019-08-09 ENCOUNTER — Encounter (HOSPITAL_BASED_OUTPATIENT_CLINIC_OR_DEPARTMENT_OTHER): Payer: Self-pay | Admitting: Emergency Medicine

## 2019-08-09 ENCOUNTER — Emergency Department (HOSPITAL_BASED_OUTPATIENT_CLINIC_OR_DEPARTMENT_OTHER)
Admission: EM | Admit: 2019-08-09 | Discharge: 2019-08-09 | Disposition: A | Discharge status: Home / Self Care | Payer: 59 | Attending: Emergency Medicine | Admitting: Emergency Medicine

## 2019-08-09 ENCOUNTER — Emergency Department (HOSPITAL_BASED_OUTPATIENT_CLINIC_OR_DEPARTMENT_OTHER): Payer: 59

## 2019-08-09 DIAGNOSIS — Z7982 Long term (current) use of aspirin: Secondary | ICD-10-CM | POA: Diagnosis not present

## 2019-08-09 DIAGNOSIS — N201 Calculus of ureter: Secondary | ICD-10-CM

## 2019-08-09 DIAGNOSIS — N132 Hydronephrosis with renal and ureteral calculous obstruction: Secondary | ICD-10-CM | POA: Insufficient documentation

## 2019-08-09 DIAGNOSIS — R1032 Left lower quadrant pain: Secondary | ICD-10-CM | POA: Diagnosis present

## 2019-08-09 DIAGNOSIS — Z887 Allergy status to serum and vaccine status: Secondary | ICD-10-CM | POA: Diagnosis not present

## 2019-08-09 DIAGNOSIS — I10 Essential (primary) hypertension: Secondary | ICD-10-CM | POA: Insufficient documentation

## 2019-08-09 DIAGNOSIS — E782 Mixed hyperlipidemia: Secondary | ICD-10-CM | POA: Insufficient documentation

## 2019-08-09 DIAGNOSIS — Z79899 Other long term (current) drug therapy: Secondary | ICD-10-CM | POA: Diagnosis not present

## 2019-08-09 DIAGNOSIS — R109 Unspecified abdominal pain: Secondary | ICD-10-CM | POA: Diagnosis not present

## 2019-08-09 LAB — URINALYSIS, ROUTINE W REFLEX MICROSCOPIC
Bilirubin Urine: NEGATIVE
Glucose, UA: NEGATIVE mg/dL
Ketones, ur: NEGATIVE mg/dL
Leukocytes,Ua: NEGATIVE
Nitrite: NEGATIVE
Protein, ur: NEGATIVE mg/dL
Specific Gravity, Urine: 1.01 (ref 1.005–1.030)
pH: 7 (ref 5.0–8.0)

## 2019-08-09 LAB — CBC WITH DIFFERENTIAL/PLATELET
Abs Immature Granulocytes: 0.02 10*3/uL (ref 0.00–0.07)
Basophils Absolute: 0 10*3/uL (ref 0.0–0.1)
Basophils Relative: 1 %
Eosinophils Absolute: 0.2 10*3/uL (ref 0.0–0.5)
Eosinophils Relative: 2 %
HCT: 41.9 % (ref 36.0–46.0)
Hemoglobin: 13.7 g/dL (ref 12.0–15.0)
Immature Granulocytes: 0 %
Lymphocytes Relative: 24 %
Lymphs Abs: 2.1 10*3/uL (ref 0.7–4.0)
MCH: 29.8 pg (ref 26.0–34.0)
MCHC: 32.7 g/dL (ref 30.0–36.0)
MCV: 91.3 fL (ref 80.0–100.0)
Monocytes Absolute: 0.7 10*3/uL (ref 0.1–1.0)
Monocytes Relative: 8 %
Neutro Abs: 5.7 10*3/uL (ref 1.7–7.7)
Neutrophils Relative %: 65 %
Platelets: 354 10*3/uL (ref 150–400)
RBC: 4.59 MIL/uL (ref 3.87–5.11)
RDW: 12.6 % (ref 11.5–15.5)
WBC: 8.8 10*3/uL (ref 4.0–10.5)
nRBC: 0 % (ref 0.0–0.2)

## 2019-08-09 LAB — URINALYSIS, MICROSCOPIC (REFLEX): WBC, UA: NONE SEEN WBC/hpf (ref 0–5)

## 2019-08-09 LAB — BASIC METABOLIC PANEL
Anion gap: 9 (ref 5–15)
BUN: 21 mg/dL — ABNORMAL HIGH (ref 6–20)
CO2: 27 mmol/L (ref 22–32)
Calcium: 9.9 mg/dL (ref 8.9–10.3)
Chloride: 101 mmol/L (ref 98–111)
Creatinine, Ser: 1 mg/dL (ref 0.44–1.00)
GFR calc Af Amer: >60 mL/min (ref >60)
GFR calc non Af Amer: >60 mL/min (ref >60)
Glucose, Bld: 103 mg/dL — ABNORMAL HIGH (ref 70–99)
Potassium: 4.7 mmol/L (ref 3.5–5.1)
Sodium: 137 mmol/L (ref 135–145)

## 2019-08-09 MED ORDER — IBUPROFEN 600 MG PO TABS: 600.0000 mg | ORAL_TABLET | Freq: Four times a day (QID) | ORAL | 0 refills | Status: DC | PRN

## 2019-08-09 MED ORDER — SODIUM CHLORIDE 0.9 % IV SOLN
INTRAVENOUS | Status: DC
  Administered 2019-08-09: 09:00:00 via INTRAVENOUS

## 2019-08-09 MED ORDER — HYDROMORPHONE HCL 1 MG/ML IJ SOLN
1.0000 mg | Freq: Once | INTRAMUSCULAR | Status: AC
  Administered 2019-08-09: 1 mg via INTRAVENOUS
  Filled 2019-08-09: qty 1

## 2019-08-09 MED ORDER — OXYCODONE-ACETAMINOPHEN 5-325 MG PO TABS: 1.0000 | ORAL_TABLET | Freq: Four times a day (QID) | ORAL | 0 refills | Status: DC | PRN

## 2019-08-09 MED ORDER — ONDANSETRON HCL 4 MG/2ML IJ SOLN
4.0000 mg | Freq: Once | INTRAMUSCULAR | Status: AC
  Administered 2019-08-09: 4 mg via INTRAVENOUS
  Filled 2019-08-09: qty 2

## 2019-08-09 MED ORDER — KETOROLAC TROMETHAMINE 15 MG/ML IJ SOLN
15.0000 mg | Freq: Once | INTRAMUSCULAR | Status: AC
  Administered 2019-08-09: 15 mg via INTRAVENOUS
  Filled 2019-08-09: qty 1

## 2019-08-09 MED ORDER — ONDANSETRON 4 MG PO TBDP: 4.0000 mg | ORAL_TABLET | Freq: Three times a day (TID) | ORAL | 0 refills | Status: DC | PRN

## 2019-08-09 NOTE — ED Triage Notes (Signed)
Pt reports L flank pain since 1am with nausea and urinary frequency.

## 2019-08-09 NOTE — ED Provider Notes (Signed)
[facility] Provider Note   CSN: [id number] Arrival date & time: 08/09/19  [id number]     History   Chief Complaint Chief Complaint  Patient presents with  . Flank Pain    HPI Haley Vega is a 58 y.o. female.     HPI   58 year old female with left flank pain.  Acute onset around 1 AM this morning.  Went to sleep in her usual state of health.  Pain has been constant since onset.  Associated with nausea.  No vomiting.  She felt that she may be constipated so used an enema and had a bowel movement without change in her symptoms.  She has been having  increased urinary frequency.  No dysuria or hematuria.  No fevers or chills.  Is that similar symptoms.  Past Medical History:  Diagnosis Date  . Allergy   . Hypercholesteremia   . Hypertension     Patient Active Problem List   Diagnosis Date Noted  . HLD (hyperlipidemia) 01/03/2015  . Essential hypertension 01/03/2015    Past Surgical History:  Procedure Laterality Date  . TONSILLECTOMY    . TUBAL LIGATION       OB History   No obstetric history on file.      Home Medications    Prior to Admission medications   Medication Sig Start Date End Date Taking? Authorizing Provider  aspirin EC 81 MG tablet Take by mouth.    [provider]  Coenzyme Q10 (COQ-10) 100 MG CAPS Take 1 capsule by mouth daily.    [provider]  losartan-hydrochlorothiazide (HYZAAR) 50-12.5 MG tablet Take 0.5 tablets by mouth daily. 03/20/19   Forrest Moron, MD  Omega-3 Fatty Acids (FISH OIL) 1200 MG CAPS Take 1,200 mg by mouth daily.    [provider]    Family History Family History  Problem Relation Age of Onset  . Colon cancer Neg Hx   . Esophageal cancer Neg Hx   . Rectal cancer Neg Hx   . Stomach cancer Neg Hx     Social History Social History   Tobacco Use  . Smoking status: Never Smoker  . Smokeless tobacco: Never Used  Substance Use Topics  . Alcohol use: Yes   Alcohol/week: 0.0 standard drinks    Comment: social 1 drink  . Drug use: No     Allergies   Influenza vaccines   Review of Systems Review of Systems  All systems reviewed and negative, other than as noted in HPI.  Physical Exam Updated Vital Signs BP (!) 187/120 (BP Location: Right Arm)   Pulse 97   Temp 97.8 F (36.6 C) (Oral)   Resp (!) 22   Ht 5\' 3"  (1.6 m)   Wt 54.4 kg   SpO2 100%   BMI 21.26 kg/m   Physical Exam Vitals signs and nursing note reviewed.  Constitutional:      Appearance: She is well-developed.     Comments: In bed.  Appears uncomfortable.  HENT:     Head: Normocephalic and atraumatic.  Eyes:     General:        Right eye: No discharge.        Left eye: No discharge.     Conjunctiva/sclera: Conjunctivae normal.  Neck:     Musculoskeletal: Neck supple.  Cardiovascular:     Rate and Rhythm: Normal rate and regular rhythm.     Heart sounds: Normal heart sounds. No murmur. No friction rub. No gallop.  Pulmonary:     Effort: Pulmonary effort is normal. No respiratory distress.     Breath sounds: Normal breath sounds.  Abdominal:     General: There is no distension.     Palpations: Abdomen is soft.     Tenderness: There is abdominal tenderness.     Comments: Tenderness to left flank without rebound or guarding.  Left CVA tenderness.  Musculoskeletal:        General: No tenderness.  Skin:    General: Skin is warm and dry.  Neurological:     Mental Status: She is alert.  Psychiatric:        Behavior: Behavior normal.        Thought Content: Thought content normal.      ED Treatments / Results  Labs (all labs ordered are listed, but only abnormal results are displayed) Labs Reviewed  BASIC METABOLIC PANEL - Abnormal; Notable for the following components:      Result Value   Glucose, Bld 103 (*)    BUN 21 (*)    All other components within normal limits  URINALYSIS, ROUTINE W REFLEX MICROSCOPIC - Abnormal; Notable for the following  components:   Hgb urine dipstick SMALL (*)    All other components within normal limits  URINALYSIS, MICROSCOPIC (REFLEX) - Abnormal; Notable for the following components:   Bacteria, UA FEW (*)    All other components within normal limits  CBC WITH DIFFERENTIAL/PLATELET    EKG None  Radiology No results found.   Ct Renal Stone Study  Result Date: 08/09/2019 CLINICAL DATA:  Left flank pain, urinary frequency, nausea EXAM: CT ABDOMEN AND PELVIS WITHOUT CONTRAST TECHNIQUE: Multidetector CT imaging of the abdomen and pelvis was performed following the standard protocol without IV contrast. COMPARISON:  None. FINDINGS: Lower chest: Lung bases are clear. Hepatobiliary: Unenhanced liver is unremarkable. Gallbladder is unremarkable. No intrahepatic or extrahepatic ductal dilatation. Pancreas: Within normal limits. Spleen: Within normal limits. Adrenals/Urinary Tract: Adrenal glands are within normal limits. Right kidney is within normal limits. Mild left perirenal edema with perinephric stranding. Mild left hydronephrosis. 3 mm distal left ureteral calculus at the UVJ (series 2/image 62). Bladder is underdistended but unremarkable. Stomach/Bowel: Stomach is within normal limits. No evidence of bowel obstruction. Normal appendix (series 2/image 50). Vascular/Lymphatic: No evidence of abdominal aortic aneurysm. Atherosclerotic calcifications of the abdominal aorta and branch vessels. No suspicious abdominopelvic lymphadenopathy. Reproductive: Uterus is within normal limits. Bilateral ovaries are within normal limits. Other: No abdominopelvic ascites. Musculoskeletal: Visualized osseous structures are within normal limits. IMPRESSION: 3 mm distal left ureteral calculus at the UVJ. Associated mild left hydronephrosis. Electronically Signed   By: Julian Hy M.D.   On: 08/09/2019 10:17    Procedures Procedures (including critical care time)  Medications Ordered in ED Medications  ketorolac  (TORADOL) 15 MG/ML injection 15 mg (has no administration in time range)  HYDROmorphone (DILAUDID) injection 1 mg (has no administration in time range)  ondansetron (ZOFRAN) injection 4 mg (has no administration in time range)  0.9 %  sodium chloride infusion (has no administration in time range)     Initial Impression / Assessment and Plan / ED Course  I have reviewed the triage vital signs and the nursing notes.  Pertinent labs & imaging results that were available during my care of the patient were reviewed by me and considered in my medical decision making (see chart for details).        58 year old female with acute onset of left flank  pain.  This sounds very consistent with ureteral colic.  Less likely UTI/pyelonephritis, AAA, diverticulitis, etc.  Plan pain/nausea medication.  Urinalysis and basic labs.  CT of the abdomen/pelvis.  CT confirms ureteral stone.  Symptoms now much improved.  Plan continue symptomatic treatment/expectant management.  Return precautions were discussed.  Urology follow-up as needed otherwise.  Final Clinical Impressions(s) / ED Diagnoses   Final diagnoses:  Left ureteral stone    ED Discharge Orders    None       Virgel Manifold, MD 08/11/19 [phone]

## 2019-08-14 DIAGNOSIS — H524 Presbyopia: Secondary | ICD-10-CM | POA: Diagnosis not present

## 2019-08-14 DIAGNOSIS — H5203 Hypermetropia, bilateral: Secondary | ICD-10-CM | POA: Diagnosis not present

## 2019-08-14 DIAGNOSIS — H52223 Regular astigmatism, bilateral: Secondary | ICD-10-CM | POA: Diagnosis not present

## 2019-09-10 DIAGNOSIS — L259 Unspecified contact dermatitis, unspecified cause: Secondary | ICD-10-CM | POA: Diagnosis not present

## 2019-09-10 DIAGNOSIS — D485 Neoplasm of uncertain behavior of skin: Secondary | ICD-10-CM | POA: Diagnosis not present

## 2019-09-10 MED FILL — BETAMETHASONE DP 0.05% CRM: 0.05 | 14 days supply | Qty: 45 | Fill #0

## 2019-09-30 ENCOUNTER — Other Ambulatory Visit: Payer: Self-pay

## 2019-09-30 ENCOUNTER — Encounter (HOSPITAL_BASED_OUTPATIENT_CLINIC_OR_DEPARTMENT_OTHER): Payer: Self-pay

## 2019-09-30 ENCOUNTER — Emergency Department (HOSPITAL_BASED_OUTPATIENT_CLINIC_OR_DEPARTMENT_OTHER)
Admission: EM | Admit: 2019-09-30 | Discharge: 2019-09-30 | Disposition: A | Discharge status: Home / Self Care | Payer: 59 | Attending: Emergency Medicine | Admitting: Emergency Medicine

## 2019-09-30 ENCOUNTER — Ambulatory Visit: Payer: Self-pay | Admitting: *Deleted

## 2019-09-30 DIAGNOSIS — R519 Headache, unspecified: Secondary | ICD-10-CM | POA: Diagnosis not present

## 2019-09-30 DIAGNOSIS — Z5321 Procedure and treatment not carried out due to patient leaving prior to being seen by health care provider: Secondary | ICD-10-CM | POA: Insufficient documentation

## 2019-09-30 NOTE — ED Notes (Signed)
Pt requesting BP be recheck states she feels better and h/a is gone, BP now 190/90, instructed pt to stay and be seen d/t high BP, pt asked if she could leave , I instructed her I cant give her any advice, pt states she is leaving and will f/u with PMD in am

## 2019-09-30 NOTE — Telephone Encounter (Signed)
Per protocol the patient was informed to got to the ED. Please advise

## 2019-09-30 NOTE — ED Triage Notes (Signed)
Pt c/o HA, "eyes feel funny like I got stuff in them"-started ~10am-states she took her BP and it is was elevated-pt states she missed one BP med dose 2 days ago-tylenol PTA relieved HA-NAD-steady gait

## 2019-09-30 NOTE — Telephone Encounter (Signed)
Patient is calling to report she has elevated BP with headache and is not feeling well. She had to leave work.  Per protocol ED advised.  Reason for Disposition . [id number] Systolic BP  >= 0000000 OR Diastolic >= [id number] AND [id number] cardiac or neurologic symptoms (e.g., chest pain, difficulty breathing, unsteady gait, blurred vision)  Answer Assessment - Initial Assessment Questions 1. BLOOD PRESSURE: "What is the blood pressure?" "Did you take at least two measurements 5 minutes apart?"     174/93, 186/94 P 79 2. ONSET: "When did you take your blood pressure?"     12:40 3. HOW: "How did you obtain the blood pressure?" (e.g., visiting nurse, automatic home BP monitor)     Automatic cuff on the arm 4. HISTORY: "Do you have a history of high blood pressure?"     yes 5. MEDICATIONS: "Are you taking any medications for blood pressure?" "Have you missed any doses recently?"     Yes- missed dose 2 days ago 6. OTHER SYMPTOMS: "Do you have any symptoms?" (e.g., headache, chest pain, blurred vision, difficulty breathing, weakness)     Headache 7. PREGNANCY: "Is there any chance you are pregnant?" "When was your last menstrual period?"     n/a  Protocols used: HIGH BLOOD PRESSURE-A-AH

## 2019-12-07 MED FILL — LOSARTAN-HCTZ 50-12.5 MG TA: 50-12.5 | 90 days supply | Qty: 45 | Fill #1

## 2020-03-22 ENCOUNTER — Other Ambulatory Visit: Payer: Self-pay | Admitting: Registered Nurse

## 2020-03-22 ENCOUNTER — Encounter: Payer: Self-pay | Admitting: Registered Nurse

## 2020-03-22 ENCOUNTER — Ambulatory Visit: Payer: 59 | Admitting: Registered Nurse

## 2020-03-22 ENCOUNTER — Other Ambulatory Visit: Payer: Self-pay

## 2020-03-22 VITALS — BP 160/88 | HR 73 | Temp 97.7°F | Resp 18 | Ht 63.0 in | Wt 127.0 lb

## 2020-03-22 DIAGNOSIS — Z13228 Encounter for screening for other metabolic disorders: Secondary | ICD-10-CM

## 2020-03-22 DIAGNOSIS — Z13220 Encounter for screening for lipoid disorders: Secondary | ICD-10-CM | POA: Diagnosis not present

## 2020-03-22 DIAGNOSIS — I10 Essential (primary) hypertension: Secondary | ICD-10-CM | POA: Diagnosis not present

## 2020-03-22 DIAGNOSIS — Z0001 Encounter for general adult medical examination with abnormal findings: Secondary | ICD-10-CM | POA: Diagnosis not present

## 2020-03-22 DIAGNOSIS — Z130 Encounter for screening for diseases of the blood and blood-forming organs and certain disorders involving the immune mechanism: Secondary | ICD-10-CM

## 2020-03-22 DIAGNOSIS — Z0000 Encounter for general adult medical examination without abnormal findings: Secondary | ICD-10-CM

## 2020-03-22 DIAGNOSIS — Z1329 Encounter for screening for other suspected endocrine disorder: Secondary | ICD-10-CM

## 2020-03-22 MED ORDER — FLUTICASONE PROPIONATE 50 MCG/ACT NA SUSP: 2.0000 | Freq: Every day | NASAL | 6 refills | Status: DC

## 2020-03-22 MED ORDER — LOSARTAN POTASSIUM-HCTZ 50-12.5 MG PO TABS: 0.5000 | ORAL_TABLET | Freq: Every day | ORAL | 3 refills | Status: DC

## 2020-03-22 MED ORDER — GUAIFENESIN-DM 100-10 MG/5ML PO SYRP: 5.0000 mL | ORAL_SOLUTION | ORAL | 0 refills | Status: DC | PRN

## 2020-03-22 MED FILL — LOSARTAN-HCTZ 50-12.5 MG TA: 50-12.5 | 90 days supply | Qty: 45 | Fill #0

## 2020-03-22 NOTE — Patient Instructions (Signed)
° ° ° °  If you have lab work done today you will be contacted with your lab results within the next 2 weeks.  If you have not heard from us then please contact us. The fastest way to get your results is to register for My Chart. ° ° °IF you received an x-ray today, you will receive an invoice from [facility]. Please contact [facility] at [phone] with questions or concerns regarding your invoice.  ° °IF you received labwork today, you will receive an invoice from [facility]. Please contact [facility] at [phone] with questions or concerns regarding your invoice.  ° °Our billing staff will not be able to assist you with questions regarding bills from these companies. ° °You will be contacted with the lab results as soon as they are available. The fastest way to get your results is to activate your My Chart account. Instructions are located on the last page of this paperwork. If you have not heard from us regarding the results in 2 weeks, please contact this office. °  ° ° ° °

## 2020-03-22 NOTE — Progress Notes (Signed)
Established Patient Office Visit  Subjective:  Patient ID: Haley Vega, female    DOB: 1961-06-21  Age: 59 y.o. MRN: [id number]  CC:  Chief Complaint  Patient presents with  . Transitions Of Care    Patient states she is here for transfer of care and physical    HPI Haley Vega presents for CPE and labs  She is without complaints  BP mildly high on arrival, denies CV symptoms, states she has not taken her medication today. Usually wnl at home, checks daily. 130/80 is avg.  Needs refill on hyzaar. Takes without issues.  Past Medical History:  Diagnosis Date  . Allergy   . Hypercholesteremia   . Hypertension     Past Surgical History:  Procedure Laterality Date  . TONSILLECTOMY    . TUBAL LIGATION      Family History  Problem Relation Age of Onset  . Colon cancer Neg Hx   . Esophageal cancer Neg Hx   . Rectal cancer Neg Hx   . Stomach cancer Neg Hx     Social History   Socioeconomic History  . Marital status: Single    Spouse name: Not on file  . Number of children: Not on file  . Years of education: Not on file  . Highest education level: Not on file  Occupational History  . 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 Smoking status: Never Smoker  . Smokeless tobacco: Never Used  Vaping Use  . Vaping Use: Never used  Substance and Sexual Activity  . Alcohol use: Yes    Alcohol/week: 0.0 standard drinks    Comment: occ  . Drug use: No  . Sexual activity: Not Currently  Other Topics Concern  . Not on file  Social History Narrative   Divorced. Education: [employer]. Exercise: Yes.   Works for [employer] Strain:   . Difficulty of Paying Living Expenses:   Food Insecurity:   . Worried About Charity fundraiser in the Last Year:   . Arboriculturist in the Last Year:   Transportation Needs:   . Film/video editor (Medical):   Marland Kitchen Lack of Transportation (Non-Medical):    Physical Activity:   . Days of Exercise per Week:   . Minutes of Exercise per Session:   Stress:   . Feeling of Stress :   Social Connections:   . Frequency of Communication with Friends and Family:   . Frequency of Social Gatherings with Friends and Family:   . Attends Religious Services:   . Active Member of Clubs or Organizations:   . Attends Archivist Meetings:   Marland Kitchen Marital Status:   Intimate Partner Violence:   . Fear of Current or Ex-Partner:   . Emotionally Abused:   Marland Kitchen Physically Abused:   . Sexually Abused:     Outpatient Medications Prior to Visit  Medication Sig Dispense Refill  . aspirin EC 81 MG tablet Take by mouth.    . Coenzyme Q10 (COQ-10) 100 MG CAPS Take 1 capsule by mouth daily.    Marland Kitchen losartan-hydrochlorothiazide (HYZAAR) 50-12.5 MG tablet Take 0.5 tablets by mouth daily. 45 tablet 3  . ibuprofen (ADVIL) 600 MG tablet Take 1 tablet (600 mg total) by mouth every 6 (six) hours as needed. 30 tablet 0  . Omega-3 Fatty Acids (FISH OIL) 1200 MG CAPS Take 1,200 mg by mouth daily.    Marland Kitchen  ondansetron (ZOFRAN ODT) 4 MG disintegrating tablet Take 1 tablet (4 mg total) by mouth every 8 (eight) hours as needed for nausea or vomiting. (Patient not taking: Reported on 03/22/2020) 20 tablet 0  . oxyCODONE-acetaminophen (PERCOCET/ROXICET) 5-325 MG tablet Take 1-2 tablets by mouth every 6 (six) hours as needed for severe pain. 12 tablet 0   Facility-Administered Medications Prior to Visit  Medication Dose Route Frequency Provider Last Rate Last Admin  . 0.9 %  sodium chloride infusion  500 mL Intravenous Once Cirigliano, Vito V, DO        Allergies  Allergen Reactions  . Influenza Vaccines Swelling    Had significant swelling of face, lips and eyelids  . Other Swelling    ROS Review of Systems  Constitutional: Negative.   HENT: Negative.   Eyes: Negative.   Respiratory: Negative.   Cardiovascular: Negative.   Gastrointestinal: Negative.   Endocrine: Negative.    Genitourinary: Negative.   Musculoskeletal: Negative.   Skin: Negative.   Allergic/Immunologic: Negative.   Neurological: Negative.   Hematological: Negative.   Psychiatric/Behavioral: Negative.   All other systems reviewed and are negative.     Objective:    Physical Exam Vitals and nursing note reviewed.  Constitutional:      General: She is not in acute distress.    Appearance: Normal appearance. She is normal weight. She is not ill-appearing, toxic-appearing or diaphoretic.  HENT:     Head: Normocephalic and atraumatic.     Right Ear: Tympanic membrane, ear canal and external ear normal. There is no impacted cerumen.     Left Ear: Tympanic membrane, ear canal and external ear normal. There is no impacted cerumen.     Nose: No congestion or rhinorrhea.     Right Sinus: Frontal sinus tenderness present.     Left Sinus: Frontal sinus tenderness present.     Comments: Incidental finding of frontal sinus tenderness    Mouth/Throat:     Mouth: Mucous membranes are moist.     Pharynx: Oropharynx is clear. No oropharyngeal exudate or posterior oropharyngeal erythema.  Eyes:     General: No scleral icterus.       Right eye: No discharge.        Left eye: No discharge.     Extraocular Movements: Extraocular movements intact.     Conjunctiva/sclera: Conjunctivae normal.     Pupils: Pupils are equal, round, and reactive to light.  Cardiovascular:     Rate and Rhythm: Normal rate and regular rhythm.     Pulses: Normal pulses.     Heart sounds: Normal heart sounds. No murmur heard.  No friction rub. No gallop.   Pulmonary:     Effort: Pulmonary effort is normal. No respiratory distress.     Breath sounds: Normal breath sounds. No stridor. No wheezing, rhonchi or rales.  Chest:     Chest wall: No tenderness.  Abdominal:     General: Abdomen is flat. Bowel sounds are normal. There is no distension.     Palpations: Abdomen is soft. There is no mass.     Tenderness: There is no  abdominal tenderness. There is no right CVA tenderness, left CVA tenderness, guarding or rebound.     Hernia: No hernia is present.  Genitourinary:    Comments: Deferred Musculoskeletal:        General: No swelling, tenderness, deformity or signs of injury. Normal range of motion.     Right lower leg: No edema.  Left lower leg: No edema.  Skin:    General: Skin is warm and dry.     Capillary Refill: Capillary refill takes less than 2 seconds.     Coloration: Skin is not jaundiced or pale.     Findings: No bruising, erythema, lesion or rash.  Neurological:     General: No focal deficit present.     Mental Status: She is alert and oriented to person, place, and time. Mental status is at baseline.     Cranial Nerves: No cranial nerve deficit.     Motor: No weakness.     Gait: Gait normal.  Psychiatric:        Mood and Affect: Mood normal.        Behavior: Behavior normal.        Thought Content: Thought content normal.        Judgment: Judgment normal.     BP (!) 160/88   Pulse 73   Temp 97.7 F (36.5 C) (Temporal)   Resp 18   Ht 5\' 3"  (1.6 m)   Wt 127 lb (57.6 kg)   SpO2 100%   BMI 22.50 kg/m  Wt Readings from Last 3 Encounters:  03/22/20 127 lb (57.6 kg)  09/30/19 120 lb (54.4 kg)  08/09/19 120 lb (54.4 kg)     There are no preventive care reminders to display for this patient.  There are no preventive care reminders to display for this patient.  Lab Results  Component Value Date   TSH 0.450 03/20/2019   Lab Results  Component Value Date   WBC 8.8 08/09/2019   HGB 13.7 08/09/2019   HCT 41.9 08/09/2019   MCV 91.3 08/09/2019   PLT 354 08/09/2019   Lab Results  Component Value Date   NA 137 08/09/2019   K 4.7 08/09/2019   CO2 27 08/09/2019   GLUCOSE 103 (H) 08/09/2019   BUN 21 (H) 08/09/2019   CREATININE 1.00 08/09/2019   BILITOT 0.5 03/20/2019   ALKPHOS 100 03/20/2019   AST 17 03/20/2019   ALT 21 03/20/2019   PROT 6.9 03/20/2019   ALBUMIN 4.5  03/20/2019   CALCIUM 9.9 08/09/2019   ANIONGAP 9 08/09/2019   Lab Results  Component Value Date   CHOL 230 (H) 03/20/2019   Lab Results  Component Value Date   HDL 67 03/20/2019   Lab Results  Component Value Date   LDLCALC 138 (H) 03/20/2019   Lab Results  Component Value Date   TRIG 123 03/20/2019   Lab Results  Component Value Date   CHOLHDL 3.4 03/20/2019   Lab Results  Component Value Date   HGBA1C 5.6 12/18/2017      Assessment & Plan:   Problem List Items Addressed This Visit    None    Visit Diagnoses    Lipid screening    -  Primary   Relevant Orders   Lipid panel   Screening for endocrine, metabolic and immunity disorder       Relevant Orders   Comprehensive metabolic panel   CBC with Differential   Hemoglobin A1c   TSH      No orders of the defined types were placed in this encounter.   Follow-up: No follow-ups on file.   PLAN  Refill meds x 1 year  Continue home bp checks, if elevated, rtc  Labs collected, will follow up as warranted  flonase and robitussin for sinus tenderness. Consider adding otc allergy med  Patient encouraged to call  clinic with any questions, comments, or concerns.  Maximiano Coss, NP

## 2020-03-23 ENCOUNTER — Encounter: Payer: Self-pay | Admitting: Registered Nurse

## 2020-03-23 ENCOUNTER — Other Ambulatory Visit: Payer: Self-pay | Admitting: Registered Nurse

## 2020-03-23 DIAGNOSIS — E0590 Thyrotoxicosis, unspecified without thyrotoxic crisis or storm: Secondary | ICD-10-CM

## 2020-03-23 DIAGNOSIS — E7800 Pure hypercholesterolemia, unspecified: Secondary | ICD-10-CM

## 2020-03-23 LAB — LIPID PANEL
Chol/HDL Ratio: 3 ratio (ref 0.0–4.4)
Cholesterol, Total: 248 mg/dL — ABNORMAL HIGH (ref 100–199)
HDL: 82 mg/dL (ref >39)
LDL Chol Calc (NIH): 151 mg/dL — ABNORMAL HIGH (ref 0–99)
Triglycerides: 87 mg/dL (ref 0–149)
VLDL Cholesterol Cal: 15 mg/dL (ref 5–40)

## 2020-03-23 LAB — COMPREHENSIVE METABOLIC PANEL
ALT: 17 IU/L (ref 0–32)
AST: 22 IU/L (ref 0–40)
Albumin/Globulin Ratio: 1.7 (ref 1.2–2.2)
Albumin: 4.3 g/dL (ref 3.8–4.8)
Alkaline Phosphatase: 98 IU/L (ref 48–121)
BUN/Creatinine Ratio: 18 (ref 12–28)
BUN: 17 mg/dL (ref 8–27)
Bilirubin Total: 0.5 mg/dL (ref 0.0–1.2)
CO2: 24 mmol/L (ref 20–29)
Calcium: 9.5 mg/dL (ref 8.7–10.3)
Chloride: 102 mmol/L (ref 96–106)
Creatinine, Ser: 0.94 mg/dL (ref 0.57–1.00)
GFR calc Af Amer: 76 mL/min/{1.73_m2} (ref >59)
GFR calc non Af Amer: 66 mL/min/{1.73_m2} (ref >59)
Globulin, Total: 2.5 g/dL (ref 1.5–4.5)
Glucose: 84 mg/dL (ref 65–99)
Potassium: 4.1 mmol/L (ref 3.5–5.2)
Sodium: 140 mmol/L (ref 134–144)
Total Protein: 6.8 g/dL (ref 6.0–8.5)

## 2020-03-23 LAB — CBC WITH DIFFERENTIAL/PLATELET
Basophils Absolute: 0 10*3/uL (ref 0.0–0.2)
Basos: 1 %
EOS (ABSOLUTE): 0.3 10*3/uL (ref 0.0–0.4)
Eos: 4 %
Hematocrit: 38.9 % (ref 34.0–46.6)
Hemoglobin: 13.2 g/dL (ref 11.1–15.9)
Immature Grans (Abs): 0 10*3/uL (ref 0.0–0.1)
Immature Granulocytes: 0 %
Lymphocytes Absolute: 2.6 10*3/uL (ref 0.7–3.1)
Lymphs: 42 %
MCH: 30.7 pg (ref 26.6–33.0)
MCHC: 33.9 g/dL (ref 31.5–35.7)
MCV: 91 fL (ref 79–97)
Monocytes Absolute: 0.4 10*3/uL (ref 0.1–0.9)
Monocytes: 6 %
Neutrophils Absolute: 3 10*3/uL (ref 1.4–7.0)
Neutrophils: 47 %
Platelets: 322 10*3/uL (ref 150–450)
RBC: 4.3 x10E6/uL (ref 3.77–5.28)
RDW: 12.9 % (ref 11.7–15.4)
WBC: 6.3 10*3/uL (ref 3.4–10.8)

## 2020-03-23 LAB — TSH: TSH: 0.347 u[IU]/mL — ABNORMAL LOW (ref 0.450–4.500)

## 2020-03-23 LAB — HEMOGLOBIN A1C
Est. average glucose Bld gHb Est-mCnc: 108 mg/dL
Hgb A1c MFr Bld: 5.4 % (ref 4.8–5.6)

## 2020-03-23 MED ORDER — ATORVASTATIN CALCIUM 20 MG PO TABS: 20.0000 mg | ORAL_TABLET | Freq: Every day | ORAL | 3 refills | Status: DC

## 2020-03-23 MED FILL — ATORVASTATIN 20 MG TABLET: 20 | 90 days supply | Qty: 90 | Fill #0

## 2020-03-24 NOTE — Telephone Encounter (Signed)
FYI

## 2020-04-01 ENCOUNTER — Other Ambulatory Visit: Payer: Self-pay | Admitting: Registered Nurse

## 2020-04-01 DIAGNOSIS — Z1231 Encounter for screening mammogram for malignant neoplasm of breast: Secondary | ICD-10-CM

## 2020-04-22 ENCOUNTER — Encounter: Payer: Self-pay | Admitting: Registered Nurse

## 2020-05-06 ENCOUNTER — Ambulatory Visit
Admission: RE | Admit: 2020-05-06 | Discharge: 2020-05-06 | Disposition: A | Discharge status: Home / Self Care | Payer: 59 | Source: Ambulatory Visit

## 2020-05-06 ENCOUNTER — Other Ambulatory Visit: Payer: Self-pay

## 2020-05-06 DIAGNOSIS — Z1231 Encounter for screening mammogram for malignant neoplasm of breast: Secondary | ICD-10-CM

## 2020-07-05 ENCOUNTER — Encounter: Payer: Self-pay | Admitting: Registered Nurse

## 2020-07-12 DIAGNOSIS — Z01419 Encounter for gynecological examination (general) (routine) without abnormal findings: Secondary | ICD-10-CM | POA: Diagnosis not present

## 2020-07-12 DIAGNOSIS — N760 Acute vaginitis: Secondary | ICD-10-CM | POA: Diagnosis not present

## 2020-07-12 DIAGNOSIS — Z6822 Body mass index (BMI) 22.0-22.9, adult: Secondary | ICD-10-CM | POA: Diagnosis not present

## 2020-07-26 ENCOUNTER — Other Ambulatory Visit: Payer: Self-pay

## 2020-07-26 ENCOUNTER — Ambulatory Visit: Payer: 59 | Admitting: Registered Nurse

## 2020-07-26 VITALS — BP 159/87 | HR 72 | Temp 97.7°F | Resp 18 | Ht 67.0 in | Wt 123.2 lb

## 2020-07-26 DIAGNOSIS — R252 Cramp and spasm: Secondary | ICD-10-CM

## 2020-07-26 DIAGNOSIS — Z2821 Immunization not carried out because of patient refusal: Secondary | ICD-10-CM | POA: Diagnosis not present

## 2020-07-26 DIAGNOSIS — E7800 Pure hypercholesterolemia, unspecified: Secondary | ICD-10-CM | POA: Diagnosis not present

## 2020-07-26 LAB — LIPID PANEL
Chol/HDL Ratio: 3 ratio (ref 0.0–4.4)
Cholesterol, Total: 232 mg/dL — ABNORMAL HIGH (ref 100–199)
HDL: 77 mg/dL (ref >39)
LDL Chol Calc (NIH): 141 mg/dL — ABNORMAL HIGH (ref 0–99)
Triglycerides: 79 mg/dL (ref 0–149)
VLDL Cholesterol Cal: 14 mg/dL (ref 5–40)

## 2020-07-26 NOTE — Progress Notes (Signed)
Established Patient Office Visit  Subjective:  Patient ID: Haley Vega, female    DOB: 12/17/1960  Age: 59 y.o. MRN: [id number]  CC:  Chief Complaint  Patient presents with  . Hyperlipidemia    Patient states she is here to Recheck her Lipids. Patient would covid testing because she is exempt from work.    HPI Haley Vega presents for recheck on lipids.  At CPE in June showed elevated total cholesterol and LDL Was started on atorvastatin but had leg cramping - stopped taking Has been closely watching diet and trying to make proper lifestyle changes No CV symptoms  Notes that she has not taken her HTN medication this morning and had a stressful drive to get here today. Otherwise bp is usually wnl. Does note nighttime cramping in calves - believes this to be related to bp meds. Has not had mag checked in past.   Requesting COVID testing - has vaccine exception, needs routine testing for employer  Past Medical History:  Diagnosis Date  . Allergy   . Hypercholesteremia   . Hypertension     Past Surgical History:  Procedure Laterality Date  . TONSILLECTOMY    . TUBAL LIGATION      Family History  Problem Relation Age of Onset  . Colon cancer Neg Hx   . Esophageal cancer Neg Hx   . Rectal cancer Neg Hx   . Stomach cancer Neg Hx     Social History   Socioeconomic History  . Marital status: Single    Spouse name: Not on file  . Number of children: Not on file  . Years of education: Not on file  . Highest education level: Not on file  Occupational History  . 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 Smoking status: Never Smoker  . Smokeless tobacco: Never Used  Vaping Use  . Vaping Use: Never used  Substance and Sexual Activity  . Alcohol use: Yes    Alcohol/week: 0.0 standard drinks    Comment: occ  . Drug use: No  . Sexual activity: Not Currently  Other Topics Concern  . Not on file  Social History Narrative   Divorced.  Education: [employer]. Exercise: Yes.   Works for [employer] Strain:   . Difficulty of Paying Living Expenses: Not on file  Food Insecurity:   . Worried About Charity fundraiser in the Last Year: Not on file  . Ran Out of Food in the Last Year: Not on file  Transportation Needs:   . Lack of Transportation (Medical): Not on file  . Lack of Transportation (Non-Medical): Not on file  Physical Activity:   . Days of Exercise per Week: Not on file  . Minutes of Exercise per Session: Not on file  Stress:   . Feeling of Stress : Not on file  Social Connections:   . Frequency of Communication with Friends and Family: Not on file  . Frequency of Social Gatherings with Friends and Family: Not on file  . Attends Religious Services: Not on file  . Active Member of Clubs or Organizations: Not on file  . Attends Archivist Meetings: Not on file  . Marital Status: Not on file  Intimate Partner Violence:   . Fear of Current or Ex-Partner: Not on file  . Emotionally Abused: Not on file  . Physically Abused: Not on file  . Sexually Abused:  Not on file    Outpatient Medications Prior to Visit  Medication Sig Dispense Refill  . aspirin EC 81 MG tablet Take by mouth.    Marland Kitchen atorvastatin (LIPITOR) 20 MG tablet Take 1 tablet (20 mg total) by mouth daily. 90 tablet 3  . Coenzyme Q10 (COQ-10) 100 MG CAPS Take 1 capsule by mouth daily.    Marland Kitchen losartan-hydrochlorothiazide (HYZAAR) 50-12.5 MG tablet Take 0.5 tablets by mouth daily. 45 tablet 3  . fluticasone (FLONASE) 50 MCG/ACT nasal spray Place 2 sprays into both nostrils daily. 16 g 6  . guaiFENesin-dextromethorphan (ROBITUSSIN DM) 100-10 MG/5ML syrup Take 5 mLs by mouth every 4 (four) hours as needed for cough. 118 mL 0  . ondansetron (ZOFRAN ODT) 4 MG disintegrating tablet Take 1 tablet (4 mg total) by mouth every 8 (eight) hours as needed for nausea or vomiting. (Patient not taking: Reported on  03/22/2020) 20 tablet 0   Facility-Administered Medications Prior to Visit  Medication Dose Route Frequency Provider Last Rate Last Admin  . 0.9 %  sodium chloride infusion  500 mL Intravenous Once Cirigliano, Vito V, DO        Allergies  Allergen Reactions  . Influenza Vaccines Swelling    Had significant swelling of face, lips and eyelids  . Other Swelling    ROS Review of Systems  Constitutional: Negative.   HENT: Negative.   Eyes: Negative.   Respiratory: Negative.   Cardiovascular: Negative.   Gastrointestinal: Negative.   Genitourinary: Negative.   Musculoskeletal: Negative.   Skin: Negative.   Neurological: Negative.   Psychiatric/Behavioral: Negative.       Objective:    Physical Exam Vitals and nursing note reviewed.  Constitutional:      General: She is not in acute distress.    Appearance: Normal appearance. She is normal weight. She is not ill-appearing, toxic-appearing or diaphoretic.  Cardiovascular:     Rate and Rhythm: Normal rate and regular rhythm.     Heart sounds: Normal heart sounds. No murmur heard.  No friction rub. No gallop.   Pulmonary:     Effort: Pulmonary effort is normal. No respiratory distress.     Breath sounds: Normal breath sounds. No stridor. No wheezing, rhonchi or rales.  Chest:     Chest wall: No tenderness.  Skin:    General: Skin is warm and dry.  Neurological:     General: No focal deficit present.     Mental Status: She is alert and oriented to person, place, and time. Mental status is at baseline.  Psychiatric:        Mood and Affect: Mood normal.        Behavior: Behavior normal.        Thought Content: Thought content normal.        Judgment: Judgment normal.     BP (!) 159/87   Pulse 72   Temp 97.7 F (36.5 C) (Temporal)   Resp 18   Ht 5\' 7"  (1.702 m)   Wt 123 lb 3.2 oz (55.9 kg)   SpO2 100%   BMI 19.30 kg/m  Wt Readings from Last 3 Encounters:  07/26/20 123 lb 3.2 oz (55.9 kg)  03/22/20 127 lb (57.6  kg)  09/30/19 120 lb (54.4 kg)     There are no preventive care reminders to display for this patient.  There are no preventive care reminders to display for this patient.  Lab Results  Component Value Date   TSH 0.347 (L) 03/22/2020  Lab Results  Component Value Date   WBC 6.3 03/22/2020   HGB 13.2 03/22/2020   HCT 38.9 03/22/2020   MCV 91 03/22/2020   PLT 322 03/22/2020   Lab Results  Component Value Date   NA 140 03/22/2020   K 4.1 03/22/2020   CO2 24 03/22/2020   GLUCOSE 84 03/22/2020   BUN 17 03/22/2020   CREATININE 0.94 03/22/2020   BILITOT 0.5 03/22/2020   ALKPHOS 98 03/22/2020   AST 22 03/22/2020   ALT 17 03/22/2020   PROT 6.8 03/22/2020   ALBUMIN 4.3 03/22/2020   CALCIUM 9.5 03/22/2020   ANIONGAP 9 08/09/2019   Lab Results  Component Value Date   CHOL 248 (H) 03/22/2020   Lab Results  Component Value Date   HDL 82 03/22/2020   Lab Results  Component Value Date   LDLCALC 151 (H) 03/22/2020   Lab Results  Component Value Date   TRIG 87 03/22/2020   Lab Results  Component Value Date   CHOLHDL 3.0 03/22/2020   Lab Results  Component Value Date   HGBA1C 5.4 03/22/2020      Assessment & Plan:   Problem List Items Addressed This Visit      Other   Elevated LDL cholesterol level - Primary   Relevant Orders   Lipid panel    Other Visit Diagnoses    Leg cramps       Relevant Orders   Magnesium   Basic Metabolic Panel   [id number] vaccination declined       Relevant Orders   Novel Coronavirus, NAA ([facility])      No orders of the defined types were placed in this encounter.   Follow-up: No follow-ups on file.   PLAN  Labs collected, will follow up as warranted  Patient encouraged to call clinic with any questions, comments, or concerns.  Maximiano Coss, NP

## 2020-07-26 NOTE — Patient Instructions (Signed)
° ° ° °  If you have lab work done today you will be contacted with your lab results within the next 2 weeks.  If you have not heard from us then please contact us. The fastest way to get your results is to register for My Chart. ° ° °IF you received an x-ray today, you will receive an invoice from [facility]. Please contact [facility] at [phone] with questions or concerns regarding your invoice.  ° °IF you received labwork today, you will receive an invoice from [facility]. Please contact [facility] at [phone] with questions or concerns regarding your invoice.  ° °Our billing staff will not be able to assist you with questions regarding bills from these companies. ° °You will be contacted with the lab results as soon as they are available. The fastest way to get your results is to activate your My Chart account. Instructions are located on the last page of this paperwork. If you have not heard from us regarding the results in 2 weeks, please contact this office. °  ° ° ° °

## 2020-07-27 LAB — SARS-COV-2, NAA 2 DAY TAT

## 2020-07-27 LAB — BASIC METABOLIC PANEL
BUN/Creatinine Ratio: 14 (ref 12–28)
BUN: 16 mg/dL (ref 8–27)
CO2: 24 mmol/L (ref 20–29)
Calcium: 9.9 mg/dL (ref 8.7–10.3)
Chloride: 103 mmol/L (ref 96–106)
Creatinine, Ser: 1.17 mg/dL — ABNORMAL HIGH (ref 0.57–1.00)
GFR calc Af Amer: 58 mL/min/{1.73_m2} — ABNORMAL LOW (ref >59)
GFR calc non Af Amer: 50 mL/min/{1.73_m2} — ABNORMAL LOW (ref >59)
Glucose: 90 mg/dL (ref 65–99)
Potassium: 4.2 mmol/L (ref 3.5–5.2)
Sodium: 140 mmol/L (ref 134–144)

## 2020-07-27 LAB — MAGNESIUM: Magnesium: 2.3 mg/dL (ref 1.6–2.3)

## 2020-07-27 LAB — NOVEL CORONAVIRUS, NAA: SARS-CoV-2, NAA: NOT DETECTED

## 2020-08-10 ENCOUNTER — Ambulatory Visit: Payer: 59 | Admitting: Obstetrics & Gynecology

## 2020-08-28 ENCOUNTER — Encounter: Payer: Self-pay | Admitting: Registered Nurse

## 2020-08-29 NOTE — Telephone Encounter (Signed)
Pt requesting low dose statin to help improve numbers further please advise

## 2020-08-30 ENCOUNTER — Encounter: Payer: Self-pay | Admitting: Registered Nurse

## 2020-08-30 MED FILL — ATORVASTATIN CALCIUM 20 MG: 20 | 90 days supply | Qty: 90 | Fill #0

## 2020-12-19 ENCOUNTER — Other Ambulatory Visit (HOSPITAL_BASED_OUTPATIENT_CLINIC_OR_DEPARTMENT_OTHER): Payer: Self-pay

## 2020-12-19 DIAGNOSIS — R519 Headache, unspecified: Secondary | ICD-10-CM | POA: Diagnosis not present

## 2020-12-19 MED ORDER — FLUTICASONE PROPIONATE 50 MCG/ACT NA SUSP
NASAL | 5 refills | Status: DC
  Filled 2020-12-19: qty 16, 30d supply, fill #0

## 2020-12-19 MED FILL — FLUTICASONE PROP 50 MCG SPR: 50 | 30 days supply | Qty: 16 | Fill #0

## 2020-12-31 ENCOUNTER — Other Ambulatory Visit (HOSPITAL_BASED_OUTPATIENT_CLINIC_OR_DEPARTMENT_OTHER): Payer: Self-pay

## 2021-01-09 ENCOUNTER — Other Ambulatory Visit (HOSPITAL_BASED_OUTPATIENT_CLINIC_OR_DEPARTMENT_OTHER): Payer: Self-pay

## 2021-01-09 DIAGNOSIS — R519 Headache, unspecified: Secondary | ICD-10-CM | POA: Diagnosis not present

## 2021-01-09 DIAGNOSIS — Z9109 Other allergy status, other than to drugs and biological substances: Secondary | ICD-10-CM | POA: Diagnosis not present

## 2021-01-09 DIAGNOSIS — J341 Cyst and mucocele of nose and nasal sinus: Secondary | ICD-10-CM | POA: Diagnosis not present

## 2021-01-09 DIAGNOSIS — J342 Deviated nasal septum: Secondary | ICD-10-CM | POA: Diagnosis not present

## 2021-01-09 DIAGNOSIS — J320 Chronic maxillary sinusitis: Secondary | ICD-10-CM | POA: Diagnosis not present

## 2021-01-09 DIAGNOSIS — J3489 Other specified disorders of nose and nasal sinuses: Secondary | ICD-10-CM | POA: Diagnosis not present

## 2021-01-09 MED ORDER — MONTELUKAST SODIUM 10 MG PO TABS
1.0000 | ORAL_TABLET | Freq: Every day | ORAL | 5 refills | Status: DC
  Filled 2021-01-09: qty 30, 30d supply, fill #0

## 2021-02-16 ENCOUNTER — Other Ambulatory Visit (HOSPITAL_BASED_OUTPATIENT_CLINIC_OR_DEPARTMENT_OTHER): Payer: Self-pay

## 2021-02-16 MED ORDER — LOSARTAN POTASSIUM-HCTZ 50-12.5 MG PO TABS
ORAL_TABLET | ORAL | 3 refills | Status: DC
  Filled 2021-02-16: qty 45, 90d supply, fill #0

## 2021-02-16 MED FILL — Atorvastatin Calcium Tab 20 MG (Base Equivalent): ORAL | 90 days supply | Qty: 90 | Fill #0 | Status: AC

## 2021-03-09 ENCOUNTER — Other Ambulatory Visit: Payer: Self-pay

## 2021-03-09 ENCOUNTER — Ambulatory Visit: Payer: 59 | Admitting: Family

## 2021-03-09 ENCOUNTER — Encounter: Payer: Self-pay | Admitting: Family

## 2021-03-09 ENCOUNTER — Other Ambulatory Visit (HOSPITAL_BASED_OUTPATIENT_CLINIC_OR_DEPARTMENT_OTHER): Payer: Self-pay

## 2021-03-09 VITALS — BP 126/82 | HR 72 | Temp 98.0°F | Resp 16 | Ht 67.0 in | Wt 126.0 lb

## 2021-03-09 DIAGNOSIS — Z13220 Encounter for screening for lipoid disorders: Secondary | ICD-10-CM | POA: Diagnosis not present

## 2021-03-09 DIAGNOSIS — Z0000 Encounter for general adult medical examination without abnormal findings: Secondary | ICD-10-CM | POA: Diagnosis not present

## 2021-03-09 DIAGNOSIS — E7800 Pure hypercholesterolemia, unspecified: Secondary | ICD-10-CM | POA: Diagnosis not present

## 2021-03-09 DIAGNOSIS — E2839 Other primary ovarian failure: Secondary | ICD-10-CM

## 2021-03-09 DIAGNOSIS — Z1231 Encounter for screening mammogram for malignant neoplasm of breast: Secondary | ICD-10-CM | POA: Diagnosis not present

## 2021-03-09 MED ORDER — LOSARTAN POTASSIUM-HCTZ 50-12.5 MG PO TABS
ORAL_TABLET | ORAL | 3 refills | Status: DC
  Filled 2021-03-09: qty 45, fill #0

## 2021-03-09 MED ORDER — ATORVASTATIN CALCIUM 10 MG PO TABS
10.0000 mg | ORAL_TABLET | Freq: Every day | ORAL | 3 refills | Status: DC
  Filled 2021-03-09: qty 90, 90d supply, fill #0

## 2021-03-09 MED ORDER — MELOXICAM 15 MG PO TABS
15.0000 mg | ORAL_TABLET | Freq: Every day | ORAL | 0 refills | Status: AC
  Filled 2021-03-09: qty 30, 30d supply, fill #0

## 2021-03-09 MED ORDER — NYSTATIN 100000 UNIT/GM EX CREA
1.0000 "application " | TOPICAL_CREAM | Freq: Two times a day (BID) | CUTANEOUS | 0 refills | Status: AC
  Filled 2021-03-09: qty 30, 15d supply, fill #0

## 2021-03-09 NOTE — Progress Notes (Signed)
Haley Vega is a 60 y.o. female with the following history as recorded in EpicCare:  Patient Active Problem List   Diagnosis Date Noted   Subclinical hyperthyroidism 03/23/2020   Elevated LDL cholesterol level 03/23/2020   HLD (hyperlipidemia) 01/03/2015   Essential hypertension 01/03/2015    Current Outpatient Medications  Medication Sig Dispense Refill   atorvastatin (LIPITOR) 10 MG tablet Take 1 tablet (10 mg total) by mouth daily. 90 tablet 3   Coenzyme Q10 (COQ-10) 100 MG CAPS Take 1 capsule by mouth daily.     meloxicam (MOBIC) 15 MG tablet Take 1 tablet (15 mg total) by mouth daily. 30 tablet 0   nystatin cream (MYCOSTATIN) Apply 1 application topically 2 (two) times daily. 30 g 0   losartan-hydrochlorothiazide (HYZAAR) 50-12.5 MG tablet Take 1/2 tablet by mouth daily. 45 tablet 3   No current facility-administered medications for this visit.    Allergies: Influenza vaccines, Other, and Statins  Past Medical History:  Diagnosis Date   Allergy    Hypercholesteremia    Hypertension     Past Surgical History:  Procedure Laterality Date   TONSILLECTOMY     TUBAL LIGATION      Family History  Problem Relation Age of Onset   Colon cancer Neg Hx    Esophageal cancer Neg Hx    Rectal cancer Neg Hx    Stomach cancer Neg Hx     Social History   Tobacco Use   Smoking status: Never   Smokeless tobacco: Never  Substance Use Topics   Alcohol use: Yes    Alcohol/week: 0.0 standard drinks    Comment: occ    Subjective:   Presents as TOC; needs yearly CPE: history of hypertension/ hyperlipidemia; does have GYN- last pap smear done in 2021; Denies any chest pain, shortness of breath, blurred vision or headache.   Review of Systems  Constitutional: Negative.   HENT: Negative.    Eyes: Negative.   Respiratory: Negative.    Cardiovascular: Negative.   Gastrointestinal: Negative.   Genitourinary: Negative.   Musculoskeletal: Negative.   Skin: Negative.   Neurological:  Negative.   Endo/Heme/Allergies: Negative.   Psychiatric/Behavioral: Negative.      Objective:  Vitals:   03/09/21 1408  BP: 126/82  Pulse: 72  Resp: 16  Temp: 98 F (36.7 C)  SpO2: 99%  Weight: 126 lb (57.2 kg)  Height: _0  (1.702 m)    General: Well developed, well nourished, in no acute distress  Skin : Warm and dry.  Head: Normocephalic and atraumatic  Eyes: Sclera and conjunctiva clear; pupils round and reactive to light; extraocular movements intact  Ears: External normal; canals clear; tympanic membranes normal  Oropharynx: Pink, supple. No suspicious lesions  Neck: Supple without thyromegaly, adenopathy  Lungs: Respirations unlabored; clear to auscultation bilaterally without wheeze, rales, rhonchi  CVS exam: normal rate and regular rhythm.  Abdomen: Soft; nontender; nondistended; normoactive bowel sounds; no masses or hepatosplenomegaly  Musculoskeletal: No deformities; no active joint inflammation  Extremities: No edema, cyanosis, clubbing  Vessels: Symmetric bilaterally  Neurologic: Alert and oriented; speech intact; face symmetrical; moves all extremities well; CNII-XII intact without focal deficit   Assessment:  1. PE (physical exam), annual   2. Lipid screening   3. Elevated LDL cholesterol level   4. Visit for screening mammogram   5. Ovarian failure     Plan:   Age appropriate preventive healthcare needs addressed; encouraged regular eye doctor and dental exams; encouraged regular exercise; will update  labs and refills as needed today; follow-up to be determined; Order for mammogram and DEXA updated; patient does not want to update any type of vaccine;  Trial of Mobic 15 mg daily for right neck/ shoulder pain- follow up if worse, no better.  Rx for topical Nystatin to use prn for intermittent vaginal itching;   This visit occurred during the SARS-CoV-2 public health emergency.  Safety protocols were in place, including screening questions prior to the  visit, additional usage of staff PPE, and extensive cleaning of exam room while observing appropriate contact time as indicated for disinfecting solutions.     No follow-ups on file.  Orders Placed This Encounter  Procedures   DG Bone Density    Standing Status:   Future    Standing Expiration Date:   03/09/2022    Scheduling Instructions:     Please schedule with screening mammogram in August 2022    Order Specific Question:   Reason for Exam (SYMPTOM  OR DIAGNOSIS REQUIRED)    Answer:   ovarian failure    Order Specific Question:   Preferred imaging location?    Answer:   [facility]   MM Digital Screening    Standing Status:   Future    Standing Expiration Date:   03/09/2022    Scheduling Instructions:     Due in August 2022- please schedule with DEXA    Order Specific Question:   Reason for Exam (SYMPTOM  OR DIAGNOSIS REQUIRED)    Answer:   screening mammogram    Order Specific Question:   Preferred imaging location?    Answer:   [facility]   CBC with Differential/Platelet   Comp Met (CMET)   Lipid panel   TSH    Requested Prescriptions   Signed Prescriptions Disp Refills   atorvastatin (LIPITOR) 10 MG tablet 90 tablet 3    Sig: Take 1 tablet (10 mg total) by mouth daily.   losartan-hydrochlorothiazide (HYZAAR) 50-12.5 MG tablet 45 tablet 3    Sig: Take 1/2 tablet by mouth daily.   meloxicam (MOBIC) 15 MG tablet 30 tablet 0    Sig: Take 1 tablet (15 mg total) by mouth daily.   nystatin cream (MYCOSTATIN) 30 g 0    Sig: Apply 1 application topically 2 (two) times daily.

## 2021-03-10 LAB — COMPREHENSIVE METABOLIC PANEL
ALT: 17 U/L (ref 0–35)
AST: 16 U/L (ref 0–37)
Albumin: 4.4 g/dL (ref 3.5–5.2)
Alkaline Phosphatase: 89 U/L (ref 39–117)
BUN: 16 mg/dL (ref 6–23)
CO2: 30 mEq/L (ref 19–32)
Calcium: 9.6 mg/dL (ref 8.4–10.5)
Chloride: 102 mEq/L (ref 96–112)
Creatinine, Ser: 0.96 mg/dL (ref 0.40–1.20)
GFR: 63.52 mL/min (ref >60.00)
Glucose, Bld: 84 mg/dL (ref 70–99)
Potassium: 4 mEq/L (ref 3.5–5.1)
Sodium: 140 mEq/L (ref 135–145)
Total Bilirubin: 0.8 mg/dL (ref 0.2–1.2)
Total Protein: 6.9 g/dL (ref 6.0–8.3)

## 2021-03-10 LAB — CBC WITH DIFFERENTIAL/PLATELET
Basophils Absolute: 0.1 10*3/uL (ref 0.0–0.1)
Basophils Relative: 1 % (ref 0.0–3.0)
Eosinophils Absolute: 0.2 10*3/uL (ref 0.0–0.7)
Eosinophils Relative: 3.2 % (ref 0.0–5.0)
HCT: 37.8 % (ref 36.0–46.0)
Hemoglobin: 12.8 g/dL (ref 12.0–15.0)
Lymphocytes Relative: 34.5 % (ref 12.0–46.0)
Lymphs Abs: 2.2 10*3/uL (ref 0.7–4.0)
MCHC: 33.9 g/dL (ref 30.0–36.0)
MCV: 90.7 fl (ref 78.0–100.0)
Monocytes Absolute: 0.4 10*3/uL (ref 0.1–1.0)
Monocytes Relative: 6.8 % (ref 3.0–12.0)
Neutro Abs: 3.5 10*3/uL (ref 1.4–7.7)
Neutrophils Relative %: 54.5 % (ref 43.0–77.0)
Platelets: 321 10*3/uL (ref 150.0–400.0)
RBC: 4.17 Mil/uL (ref 3.87–5.11)
RDW: 13 % (ref 11.5–15.5)
WBC: 6.5 10*3/uL (ref 4.0–10.5)

## 2021-03-10 LAB — LIPID PANEL
Cholesterol: 184 mg/dL (ref 0–200)
HDL: 80.6 mg/dL (ref >39.00)
LDL Cholesterol: 83 mg/dL (ref 0–99)
NonHDL: 103.08
Total CHOL/HDL Ratio: 2
Triglycerides: 98 mg/dL (ref 0.0–149.0)
VLDL: 19.6 mg/dL (ref 0.0–40.0)

## 2021-03-10 LAB — TSH: TSH: 0.46 u[IU]/mL (ref 0.35–4.50)

## 2021-03-23 ENCOUNTER — Other Ambulatory Visit: Payer: Self-pay | Admitting: Family

## 2021-03-23 ENCOUNTER — Other Ambulatory Visit: Payer: Self-pay | Admitting: Internal Medicine

## 2021-03-23 DIAGNOSIS — Z1231 Encounter for screening mammogram for malignant neoplasm of breast: Secondary | ICD-10-CM

## 2021-04-10 ENCOUNTER — Encounter: Payer: Self-pay | Admitting: Family

## 2021-04-13 ENCOUNTER — Other Ambulatory Visit: Payer: Self-pay

## 2021-04-13 ENCOUNTER — Ambulatory Visit
Admission: RE | Admit: 2021-04-13 | Discharge: 2021-04-13 | Disposition: A | Discharge status: Home / Self Care | Payer: 59 | Source: Ambulatory Visit | Attending: Family | Admitting: Family

## 2021-04-13 DIAGNOSIS — Z780 Asymptomatic menopausal state: Secondary | ICD-10-CM | POA: Diagnosis not present

## 2021-04-13 DIAGNOSIS — E2839 Other primary ovarian failure: Secondary | ICD-10-CM

## 2021-04-13 DIAGNOSIS — M85852 Other specified disorders of bone density and structure, left thigh: Secondary | ICD-10-CM | POA: Diagnosis not present

## 2021-04-14 ENCOUNTER — Other Ambulatory Visit: Payer: Self-pay | Admitting: Family

## 2021-04-14 ENCOUNTER — Encounter: Payer: Self-pay | Admitting: Family

## 2021-04-14 DIAGNOSIS — M542 Cervicalgia: Secondary | ICD-10-CM

## 2021-04-14 DIAGNOSIS — M25519 Pain in unspecified shoulder: Secondary | ICD-10-CM

## 2021-04-19 ENCOUNTER — Encounter: Payer: Self-pay | Admitting: Family Medicine

## 2021-04-19 ENCOUNTER — Ambulatory Visit (INDEPENDENT_AMBULATORY_CARE_PROVIDER_SITE_OTHER): Payer: 59 | Admitting: Family Medicine

## 2021-04-19 ENCOUNTER — Other Ambulatory Visit (HOSPITAL_BASED_OUTPATIENT_CLINIC_OR_DEPARTMENT_OTHER): Payer: Self-pay

## 2021-04-19 ENCOUNTER — Ambulatory Visit: Payer: Self-pay

## 2021-04-19 ENCOUNTER — Other Ambulatory Visit: Payer: Self-pay

## 2021-04-19 VITALS — BP 130/86 | Ht 63.0 in | Wt 120.0 lb

## 2021-04-19 DIAGNOSIS — M7501 Adhesive capsulitis of right shoulder: Secondary | ICD-10-CM | POA: Diagnosis not present

## 2021-04-19 DIAGNOSIS — M67912 Unspecified disorder of synovium and tendon, left shoulder: Secondary | ICD-10-CM

## 2021-04-19 MED ORDER — TRIAMCINOLONE ACETONIDE 40 MG/ML IJ SUSP
40.0000 mg | Freq: Once | INTRAMUSCULAR | Status: AC
  Administered 2021-04-19: 40 mg via INTRA_ARTICULAR

## 2021-04-19 MED ORDER — NITROGLYCERIN 0.2 MG/HR TD PT24
MEDICATED_PATCH | TRANSDERMAL | 11 refills | Status: AC
  Filled 2021-04-19: qty 20, 80d supply, fill #0

## 2021-04-19 NOTE — Patient Instructions (Signed)
Nice to meet you Please try heat before exercises and ice after for the right shoulder  Please try the exercises  Please try the nitro patches for the left shoulder   Please send me a message in MyChart with any questions or updates.  Please see me back in 4 weeks.   --Dr. Raeford Razor  Nitroglycerin Protocol  Apply 1/4 nitroglycerin patch to affected area daily. Change position of patch within the affected area every 24 hours. You may experience a headache during the first 1-2 weeks of using the patch, these should subside. If you experience headaches after beginning nitroglycerin patch treatment, you may take your preferred over the counter pain reliever. Another side effect of the nitroglycerin patch is skin irritation or rash related to patch adhesive. Please notify our office if you develop more severe headaches or rash, and stop the patch. Tendon healing with nitroglycerin patch may require 12 to 24 weeks depending on the extent of injury. Men should not use if taking Viagra, Cialis, or Levitra.  Do not use if you have migraines or rosacea.

## 2021-04-19 NOTE — Assessment & Plan Note (Signed)
Chronic changes of the the rotator cuff.  Has good range of motion. -Counseled on home exercise therapy and supportive care. -Nitro patches. -Could consider injection or physical therapy.

## 2021-04-19 NOTE — Assessment & Plan Note (Signed)
Range of motion is limited compared to the contralateral side.  Symptoms likely for degenerative changes. -Counseled on home exercise therapy and supportive care. -Injection today. -Could consider imaging or physical therapy.

## 2021-04-19 NOTE — Progress Notes (Signed)
Haley Vega - 60 y.o. female MRN [id number]  Date of birth: [birth year]  SUBJECTIVE:  Including CC & ROS.  No chief complaint on file.   Haley Vega is a 60 y.o. female that is presenting with right shoulder pain and left shoulder pain.  Shoulder on the right side has been painful for about 2 months.  Denies any specific injury.  Left shoulder feels different.  Left shoulder pain is less severe.   Review of Systems See HPI   HISTORY: Past Medical, Surgical, Social, and Family History Reviewed & Updated per EMR.   Pertinent Historical Findings include:  Past Medical History:  Diagnosis Date   Allergy    Hypercholesteremia    Hypertension     Past Surgical History:  Procedure Laterality Date   TONSILLECTOMY     TUBAL LIGATION      Family History  Problem Relation Age of Onset   Colon cancer Neg Hx    Esophageal cancer Neg Hx    Rectal cancer Neg Hx    Stomach cancer Neg Hx     Social History   Socioeconomic History   Marital status: Single    Spouse name: Not on file   Number of children: Not on file   Years of education: Not on file   Highest education level: Not on file  Occupational History   Occupation: [employer]    Employer: [employer]  Tobacco Use   Smoking status: Never   Smokeless tobacco: Never  Vaping Use   Vaping Use: Never used  Substance and Sexual Activity   Alcohol use: Yes    Alcohol/week: 0.0 standard drinks    Comment: occ   Drug use: No   Sexual activity: Not Currently  Other Topics Concern   Not on file  Social History Narrative   Divorced. Education: [employer]. Exercise: Yes.   Works for [employer] Strain: Not on [employer] Insecurity: Not on file  Transportation Needs: Not on file  Physical Activity: Not on file  Stress: Not on file  Social Connections: Not on file  Intimate Partner Violence: Not on file     PHYSICAL EXAM:  VS: BP 130/86 (BP Location: Left Arm,  Patient Position: Sitting, Cuff Size: Normal)   Ht 5\' 3"  (1.6 m)   Wt 120 lb (54.4 kg)   BMI 21.26 kg/m  Physical Exam Gen: NAD, alert, cooperative with exam, well-appearing MSK:  Right shoulder: Limited external rotation compared to the contralateral side. Limited external rotation abduction. Normal empty can testing. Normal strength resistance. Neurovascular intact  Limited ultrasound: Right shoulder, left shoulder:  Right shoulder: Small effusion encircling the biceps tendon. Normal-appearing subscapularis. Normal-appearing supraspinatus. No change in posterior glenohumeral joint.  Left shoulder: Normal-appearing biceps tendon. Degenerative changes of the supraspinatus. No changes of the posterior glenohumeral joint. Normal-appearing AC joint.  Summary: Findings of the left shoulder consistent with tendinopathy.  Right shoulder with no significant changes  Ultrasound and interpretation by Clearance Coots, MD   Aspiration/Injection Procedure Note Haley Vega [birth year]  Procedure: Injection Indications: Right shoulder pain  Procedure Details Consent: Risks of procedure as well as the alternatives and risks of each were explained to the (patient/caregiver).  Consent for procedure obtained. Time Out: Verified patient identification, verified procedure, site/side was marked, verified correct patient position, special equipment/implants available, medications/allergies/relevent history reviewed, required imaging and test results available.  Performed.  The area was cleaned with iodine and alcohol swabs.  The right glenohumeral joint was injected using 3 cc 1% lidocaine on a 22-gauge 3-1/2 inch needle.  The syringe was switched and a mixture containing 1 cc's of 40 mg Kenalog, 3 cc 1% lidocaine, and 3 cc's of 0.25% bupivacaine was injected.  Ultrasound was used. Images were obtained in short views showing the injection.     A sterile dressing was applied.  Patient did  tolerate procedure well.    ASSESSMENT & PLAN:   Tendinopathy of left rotator cuff Chronic changes of the the rotator cuff.  Has good range of motion. -Counseled on home exercise therapy and supportive care. -Nitro patches. -Could consider injection or physical therapy.  Adhesive capsulitis of right shoulder Range of motion is limited compared to the contralateral side.  Symptoms likely for degenerative changes. -Counseled on home exercise therapy and supportive care. -Injection today. -Could consider imaging or physical therapy.

## 2021-05-16 ENCOUNTER — Other Ambulatory Visit: Payer: Self-pay

## 2021-05-16 ENCOUNTER — Ambulatory Visit
Admission: RE | Admit: 2021-05-16 | Discharge: 2021-05-16 | Disposition: A | Discharge status: Home / Self Care | Payer: 59 | Source: Ambulatory Visit

## 2021-05-16 DIAGNOSIS — Z1231 Encounter for screening mammogram for malignant neoplasm of breast: Secondary | ICD-10-CM

## 2021-05-25 ENCOUNTER — Ambulatory Visit: Payer: 59 | Admitting: Family Medicine

## 2021-07-13 DIAGNOSIS — Z6822 Body mass index (BMI) 22.0-22.9, adult: Secondary | ICD-10-CM | POA: Diagnosis not present

## 2021-07-13 DIAGNOSIS — Z01419 Encounter for gynecological examination (general) (routine) without abnormal findings: Secondary | ICD-10-CM | POA: Diagnosis not present

## 2021-08-01 ENCOUNTER — Encounter: Payer: Self-pay | Admitting: Family

## 2021-08-03 ENCOUNTER — Other Ambulatory Visit (HOSPITAL_BASED_OUTPATIENT_CLINIC_OR_DEPARTMENT_OTHER): Payer: Self-pay

## 2021-08-03 ENCOUNTER — Other Ambulatory Visit: Payer: Self-pay | Admitting: Family

## 2021-08-03 MED ORDER — AMLODIPINE BESYLATE 2.5 MG PO TABS
2.5000 mg | ORAL_TABLET | Freq: Every day | ORAL | 0 refills | Status: DC
  Filled 2021-08-03: qty 30, 30d supply, fill #0

## 2021-08-22 ENCOUNTER — Ambulatory Visit: Payer: 59 | Admitting: Family

## 2021-08-22 ENCOUNTER — Other Ambulatory Visit (HOSPITAL_BASED_OUTPATIENT_CLINIC_OR_DEPARTMENT_OTHER): Payer: Self-pay

## 2021-08-22 ENCOUNTER — Other Ambulatory Visit: Payer: Self-pay

## 2021-08-22 VITALS — BP 130/76 | HR 78 | Temp 98.7°F | Ht 63.0 in | Wt 128.8 lb

## 2021-08-22 DIAGNOSIS — I10 Essential (primary) hypertension: Secondary | ICD-10-CM | POA: Diagnosis not present

## 2021-08-22 DIAGNOSIS — E785 Hyperlipidemia, unspecified: Secondary | ICD-10-CM | POA: Diagnosis not present

## 2021-08-22 MED ORDER — AMLODIPINE BESYLATE 2.5 MG PO TABS
2.5000 mg | ORAL_TABLET | Freq: Every day | ORAL | 1 refills | Status: AC
  Filled 2021-08-22 (×2): qty 90, 90d supply, fill #0

## 2021-08-22 NOTE — Progress Notes (Signed)
Haley Vega is a 60 y.o. female with the following history as recorded in [facility]:  Patient Active Problem List   Diagnosis Date Noted   Adhesive capsulitis of right shoulder 04/19/2021   Tendinopathy of left rotator cuff 04/19/2021   Subclinical hyperthyroidism 03/23/2020   Elevated LDL cholesterol level 03/23/2020   HLD (hyperlipidemia) 01/03/2015   Essential hypertension 01/03/2015    Current Outpatient Medications  Medication Sig Dispense Refill   Coenzyme Q10 (COQ-10) 100 MG CAPS Take 1 capsule by mouth daily.     nystatin cream (MYCOSTATIN) Apply 1 application topically 2 (two) times daily. 30 g 0   amLODipine (NORVASC) 2.5 MG tablet Take 1 tablet (2.5 mg total) by mouth daily. 90 tablet 1   meloxicam (MOBIC) 15 MG tablet Take 1 tablet (15 mg total) by mouth daily. (Patient not taking: Reported on 08/22/2021) 30 tablet 0   nitroGLYCERIN (NITRODUR - DOSED IN MG/24 HR) 0.2 mg/hr patch Cut and apply 1/4 patch to most painful area every 24 hours (Patient not taking: Reported on 08/22/2021) 30 patch 11   No current facility-administered medications for this visit.    Allergies: Influenza vaccines, Other, and Statins  Past Medical History:  Diagnosis Date   Allergy    Hypercholesteremia    Hypertension     Past Surgical History:  Procedure Laterality Date   TONSILLECTOMY     TUBAL LIGATION      Family History  Problem Relation Age of Onset   Colon cancer Neg Hx    Esophageal cancer Neg Hx    Rectal cancer Neg Hx    Stomach cancer Neg Hx     Social History   Tobacco Use   Smoking status: Never   Smokeless tobacco: Never  Substance Use Topics   Alcohol use: Yes    Alcohol/week: 0.0 standard drinks    Comment: occ    Subjective:  Patient was recently changed to Amlodipine due to concerns for muscle aches on Losartan. She notes that she likes this medication better but is still having to take coq 10 at night to help with symptoms. She asks for quick appointment as  she has to be at attorney's office at 4 pm ( OV scheduled at 3:20) for closing; she is moving to [place] next week and is planning to establish with new PCP there.  She does want labs updated today to evaluate her cholesterol. She has been off her Lipitor since earlier this summer.      Objective:  Vitals:   08/22/21 1526  BP: 130/76  Pulse: 78  Temp: 98.7 F (37.1 C)  TempSrc: Oral  SpO2: 95%  Weight: 128 lb 12.8 oz (58.4 kg)  Height: 5' 3" (1.6 m)    General: Well developed, well nourished, in no acute distress  Skin : Warm and dry.  Head: Normocephalic and atraumatic  Lungs: Respirations unlabored; clear to auscultation bilaterally without wheeze, rales, rhonchi  CVS exam: normal rate and regular rhythm.  Neurologic: Alert and oriented; speech intact; face symmetrical; moves all extremities well; CNII-XII intact without focal deficit   Assessment:  1. Hyperlipidemia, unspecified hyperlipidemia type   2. Essential hypertension     Plan:  Not currently on statin; update labs today; Patient prefers to stay on Amlodipine; refill updated;  She is moving to [facility] next week and will plan to establish with new PCP there. She does not have time today to discuss other sources that may be contributing to her chronic muscle pains. She will  plan to discuss with new PCP in [place] or return here if she does not find new provider.   This visit occurred during the SARS-CoV-2 public health emergency.  Safety protocols were in place, including screening questions prior to the visit, additional usage of staff PPE, and extensive cleaning of exam room while observing appropriate contact time as indicated for disinfecting solutions.    No follow-ups on file.  Orders Placed This Encounter  Procedures   CBC with Differential/Platelet   Comp Met (CMET)   Lipid panel    Requested Prescriptions   Signed Prescriptions Disp Refills   amLODipine (NORVASC) 2.5 MG tablet 90 tablet 1    Sig:  Take 1 tablet (2.5 mg total) by mouth daily.     

## 2021-08-23 ENCOUNTER — Encounter: Payer: Self-pay | Admitting: Family

## 2021-08-23 LAB — LIPID PANEL
Cholesterol: 262 mg/dL — ABNORMAL HIGH (ref 0–200)
HDL: 95.6 mg/dL (ref >39.00)
LDL Cholesterol: 156 mg/dL — ABNORMAL HIGH (ref 0–99)
NonHDL: 166.12
Total CHOL/HDL Ratio: 3
Triglycerides: 50 mg/dL (ref 0.0–149.0)
VLDL: 10 mg/dL (ref 0.0–40.0)

## 2021-08-23 LAB — CBC WITH DIFFERENTIAL/PLATELET
Basophils Absolute: 0.1 10*3/uL (ref 0.0–0.1)
Basophils Relative: 1.2 % (ref 0.0–3.0)
Eosinophils Absolute: 0.1 10*3/uL (ref 0.0–0.7)
Eosinophils Relative: 2.2 % (ref 0.0–5.0)
HCT: 39.7 % (ref 36.0–46.0)
Hemoglobin: 13.3 g/dL (ref 12.0–15.0)
Lymphocytes Relative: 33.8 % (ref 12.0–46.0)
Lymphs Abs: 2.1 10*3/uL (ref 0.7–4.0)
MCHC: 33.6 g/dL (ref 30.0–36.0)
MCV: 91.2 fl (ref 78.0–100.0)
Monocytes Absolute: 0.5 10*3/uL (ref 0.1–1.0)
Monocytes Relative: 7.5 % (ref 3.0–12.0)
Neutro Abs: 3.4 10*3/uL (ref 1.4–7.7)
Neutrophils Relative %: 55.3 % (ref 43.0–77.0)
Platelets: 325 10*3/uL (ref 150.0–400.0)
RBC: 4.35 Mil/uL (ref 3.87–5.11)
RDW: 13.5 % (ref 11.5–15.5)
WBC: 6.2 10*3/uL (ref 4.0–10.5)

## 2021-08-23 LAB — COMPREHENSIVE METABOLIC PANEL
ALT: 18 U/L (ref 0–35)
AST: 20 U/L (ref 0–37)
Albumin: 4.6 g/dL (ref 3.5–5.2)
Alkaline Phosphatase: 84 U/L (ref 39–117)
BUN: 18 mg/dL (ref 6–23)
CO2: 29 mEq/L (ref 19–32)
Calcium: 9.8 mg/dL (ref 8.4–10.5)
Chloride: 102 mEq/L (ref 96–112)
Creatinine, Ser: 0.94 mg/dL (ref 0.40–1.20)
GFR: 64.93 mL/min (ref >60.00)
Glucose, Bld: 88 mg/dL (ref 70–99)
Potassium: 3.9 mEq/L (ref 3.5–5.1)
Sodium: 139 mEq/L (ref 135–145)
Total Bilirubin: 0.8 mg/dL (ref 0.2–1.2)
Total Protein: 7.1 g/dL (ref 6.0–8.3)

## 2021-08-28 ENCOUNTER — Other Ambulatory Visit (HOSPITAL_BASED_OUTPATIENT_CLINIC_OR_DEPARTMENT_OTHER): Payer: Self-pay

## 2021-08-28 ENCOUNTER — Encounter: Payer: Self-pay | Admitting: Family

## 2021-08-29 ENCOUNTER — Other Ambulatory Visit: Payer: Self-pay | Admitting: Family

## 2021-08-29 ENCOUNTER — Other Ambulatory Visit (HOSPITAL_BASED_OUTPATIENT_CLINIC_OR_DEPARTMENT_OTHER): Payer: Self-pay

## 2021-08-29 MED ORDER — EZETIMIBE 10 MG PO TABS
10.0000 mg | ORAL_TABLET | Freq: Every day | ORAL | 0 refills | Status: DC
  Filled 2021-08-29: qty 90, 90d supply, fill #0

## 2021-09-04 ENCOUNTER — Other Ambulatory Visit (HOSPITAL_BASED_OUTPATIENT_CLINIC_OR_DEPARTMENT_OTHER): Payer: Self-pay

## 2021-09-04 MED ORDER — EZETIMIBE 10 MG PO TABS: 10.0000 mg | ORAL_TABLET | Freq: Every day | ORAL | 0 refills | Status: AC

## 2021-10-15 DIAGNOSIS — M545 Low back pain, unspecified: Secondary | ICD-10-CM | POA: Diagnosis not present

## 2021-10-15 DIAGNOSIS — S300XXA Contusion of lower back and pelvis, initial encounter: Secondary | ICD-10-CM | POA: Diagnosis not present

## 2021-10-15 DIAGNOSIS — R519 Headache, unspecified: Secondary | ICD-10-CM | POA: Diagnosis not present

## 2021-10-15 DIAGNOSIS — S0990XA Unspecified injury of head, initial encounter: Secondary | ICD-10-CM | POA: Diagnosis not present

## 2021-10-15 DIAGNOSIS — W01198A Fall on same level from slipping, tripping and stumbling with subsequent striking against other object, initial encounter: Secondary | ICD-10-CM | POA: Diagnosis not present

## 2021-10-15 DIAGNOSIS — Z136 Encounter for screening for cardiovascular disorders: Secondary | ICD-10-CM | POA: Diagnosis not present

## 2021-12-01 DIAGNOSIS — R519 Headache, unspecified: Secondary | ICD-10-CM | POA: Diagnosis not present

## 2021-12-15 DIAGNOSIS — R519 Headache, unspecified: Secondary | ICD-10-CM | POA: Diagnosis not present

## 2021-12-20 DIAGNOSIS — Z135 Encounter for screening for eye and ear disorders: Secondary | ICD-10-CM | POA: Diagnosis not present

## 2021-12-20 DIAGNOSIS — H524 Presbyopia: Secondary | ICD-10-CM | POA: Diagnosis not present

## 2021-12-20 DIAGNOSIS — H259 Unspecified age-related cataract: Secondary | ICD-10-CM | POA: Diagnosis not present

## 2022-01-01 ENCOUNTER — Other Ambulatory Visit (HOSPITAL_COMMUNITY): Payer: Self-pay

## 2022-01-02 DIAGNOSIS — Z1159 Encounter for screening for other viral diseases: Secondary | ICD-10-CM | POA: Diagnosis not present

## 2022-01-02 DIAGNOSIS — Z0000 Encounter for general adult medical examination without abnormal findings: Secondary | ICD-10-CM | POA: Diagnosis not present

## 2022-01-02 DIAGNOSIS — I10 Essential (primary) hypertension: Secondary | ICD-10-CM | POA: Diagnosis not present

## 2022-01-10 DIAGNOSIS — I10 Essential (primary) hypertension: Secondary | ICD-10-CM | POA: Diagnosis not present

## 2022-01-10 DIAGNOSIS — Z760 Encounter for issue of repeat prescription: Secondary | ICD-10-CM | POA: Diagnosis not present

## 2022-01-16 DIAGNOSIS — E785 Hyperlipidemia, unspecified: Secondary | ICD-10-CM | POA: Diagnosis not present

## 2022-01-16 DIAGNOSIS — Z23 Encounter for immunization: Secondary | ICD-10-CM | POA: Diagnosis not present

## 2022-01-16 DIAGNOSIS — I10 Essential (primary) hypertension: Secondary | ICD-10-CM | POA: Diagnosis not present

## 2022-01-23 DIAGNOSIS — E049 Nontoxic goiter, unspecified: Secondary | ICD-10-CM | POA: Diagnosis not present

## 2022-01-30 DIAGNOSIS — I10 Essential (primary) hypertension: Secondary | ICD-10-CM | POA: Diagnosis not present

## 2022-01-30 DIAGNOSIS — E785 Hyperlipidemia, unspecified: Secondary | ICD-10-CM | POA: Diagnosis not present

## 2022-02-13 DIAGNOSIS — H2513 Age-related nuclear cataract, bilateral: Secondary | ICD-10-CM | POA: Diagnosis not present

## 2022-02-13 DIAGNOSIS — H35463 Secondary vitreoretinal degeneration, bilateral: Secondary | ICD-10-CM | POA: Diagnosis not present

## 2022-02-13 DIAGNOSIS — H18413 Arcus senilis, bilateral: Secondary | ICD-10-CM | POA: Diagnosis not present

## 2022-03-02 IMAGING — MG MM DIGITAL SCREENING BILAT W/ TOMO AND CAD
8 series · 9 of 24 positions shown · non-contrast
Comparison: Previous exam(s).

CLINICAL DATA: Screening.

EXAM:
DIGITAL SCREENING BILATERAL MAMMOGRAM WITH TOMOSYNTHESIS AND CAD
TECHNIQUE: Bilateral screening digital craniocaudal and mediolateral oblique
mammograms were obtained. Bilateral screening digital breast
tomosynthesis was performed. The images were evaluated with
computer-aided detection.

[R MLO synth-2D]
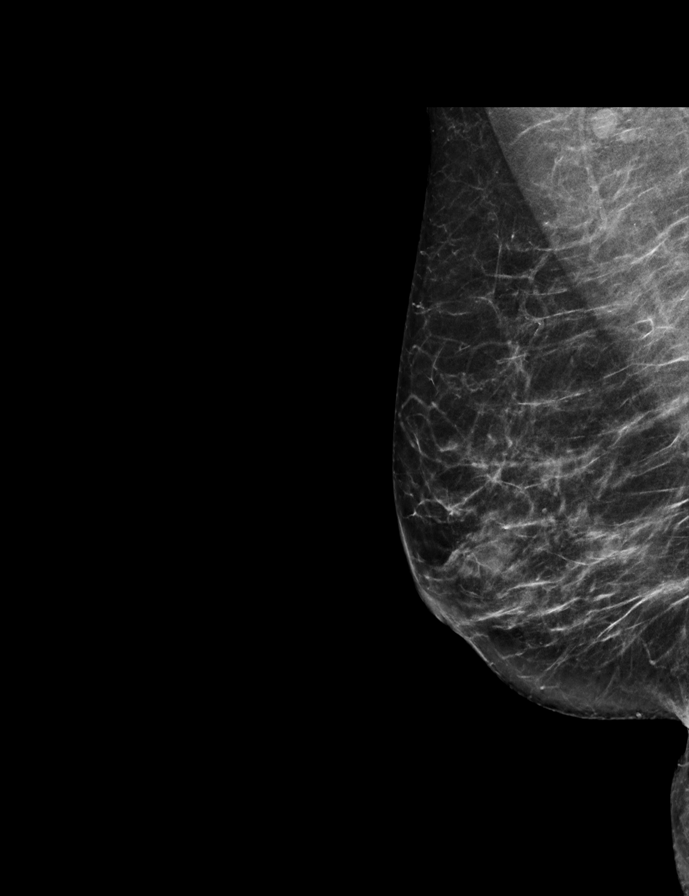

[L MLO synth-2D]
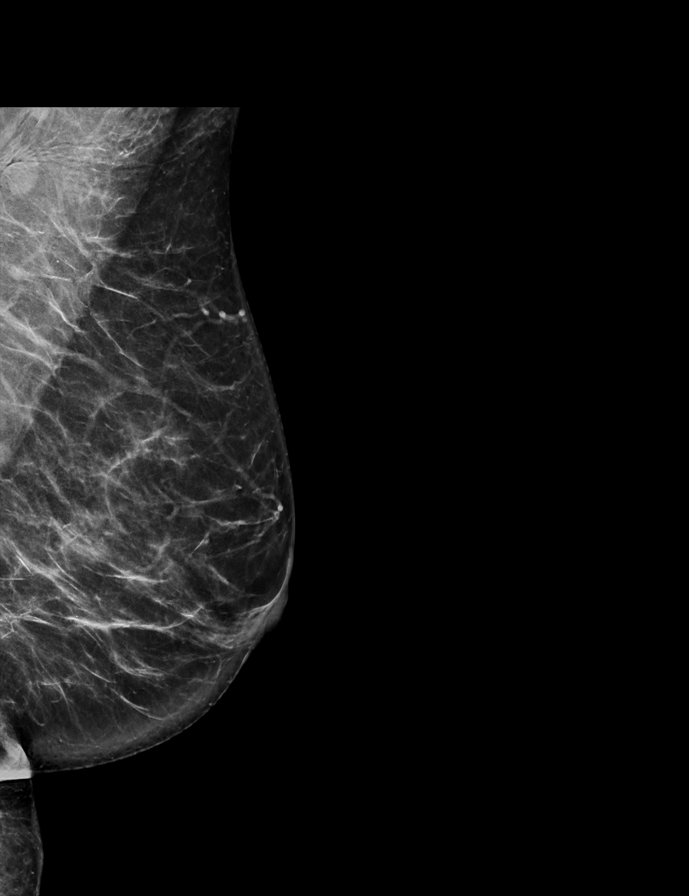

[L CC synth-2D]
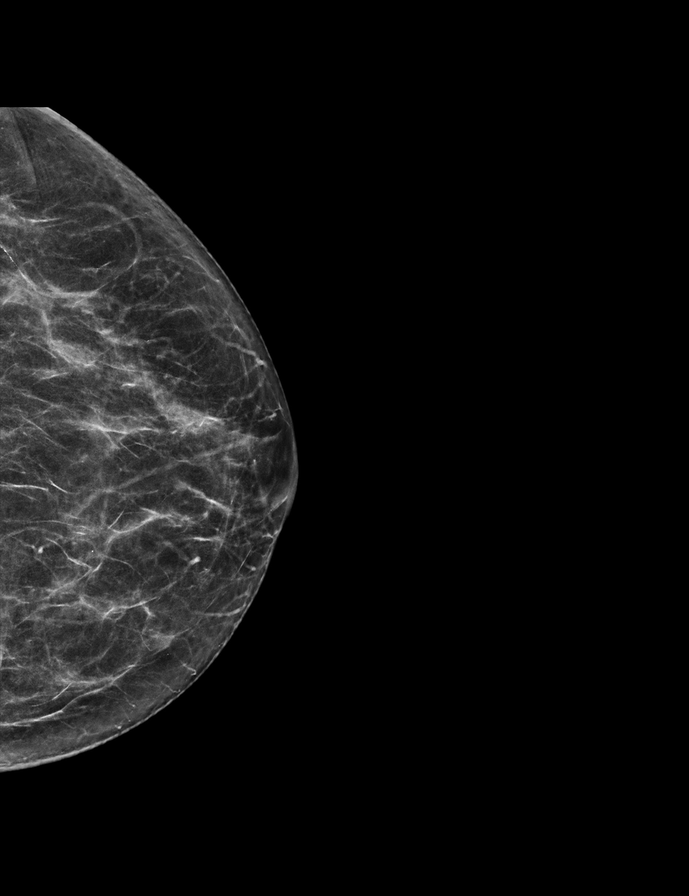

[R CC synth-2D]
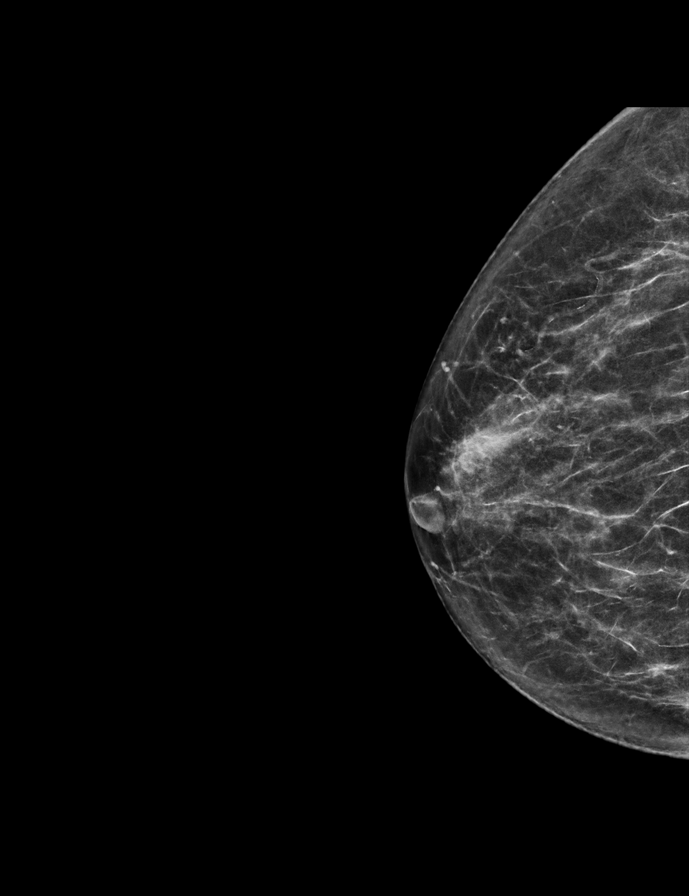

[L MLO tomo · 2 of 65 frames shown]
[frame 21/65]
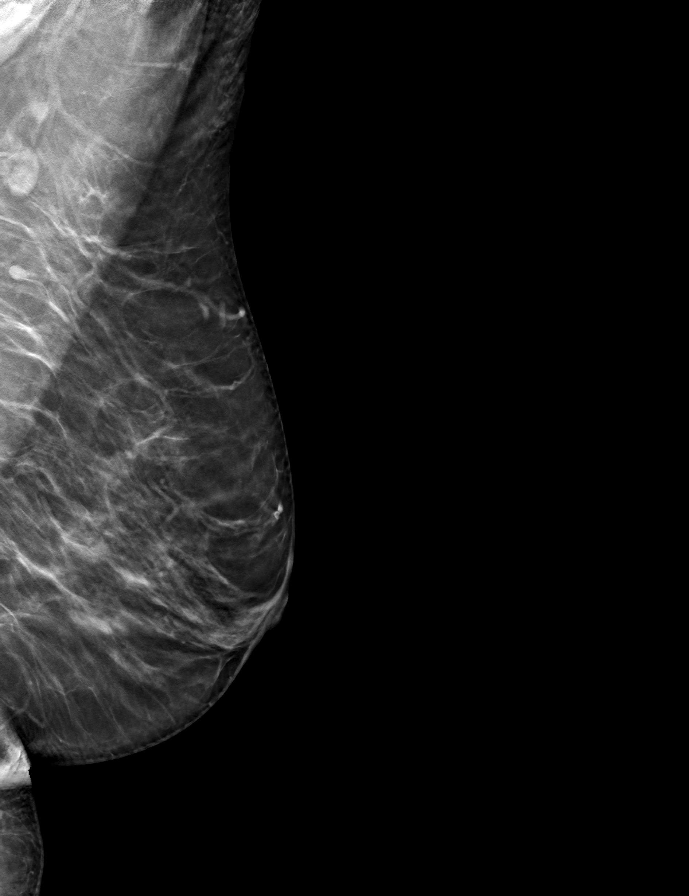
[frame 33/65]
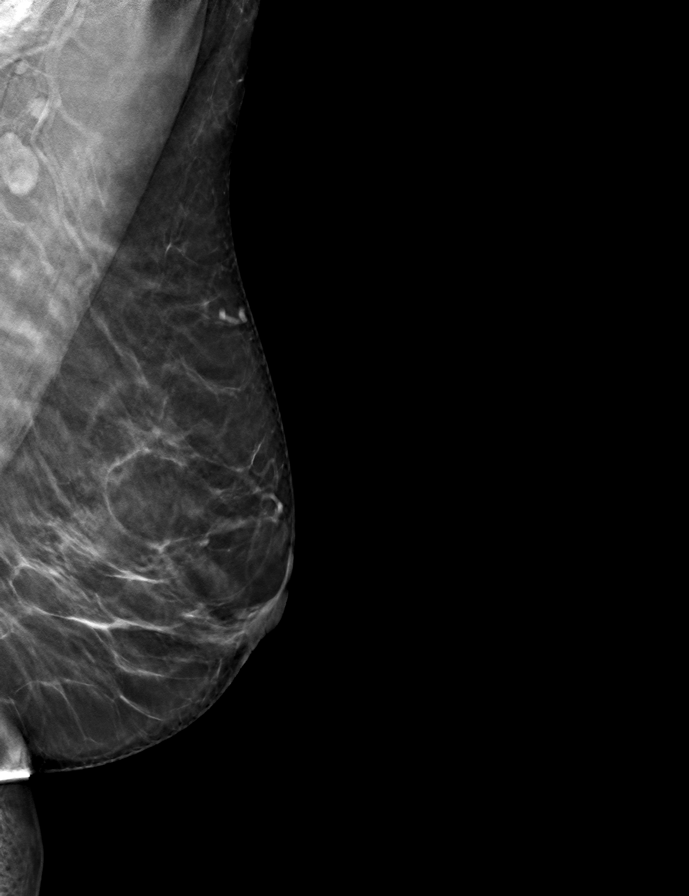

[R CC tomo · tomo slice 29/56.0]
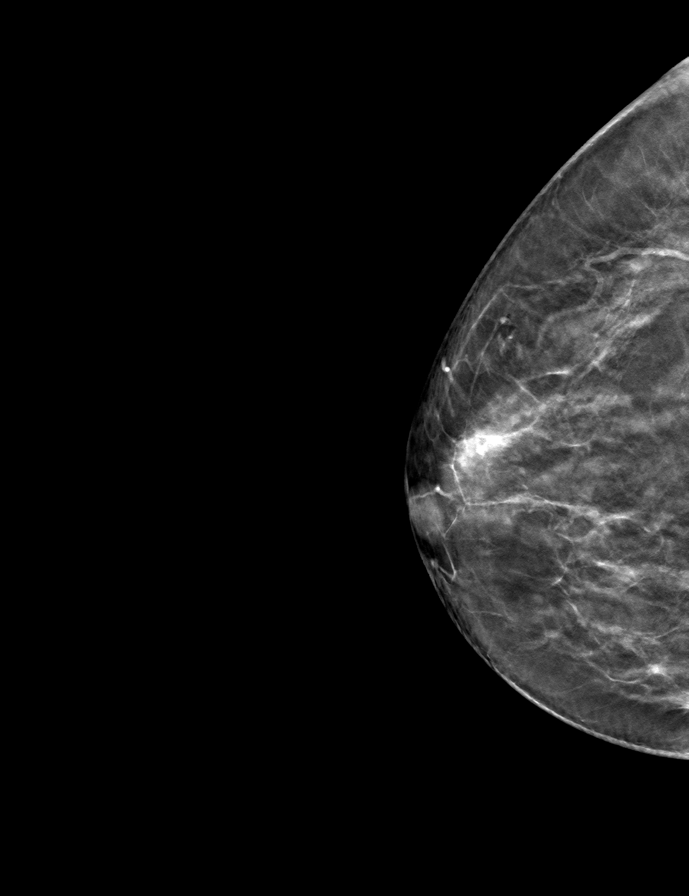

[L CC tomo · tomo slice 29/57.0]
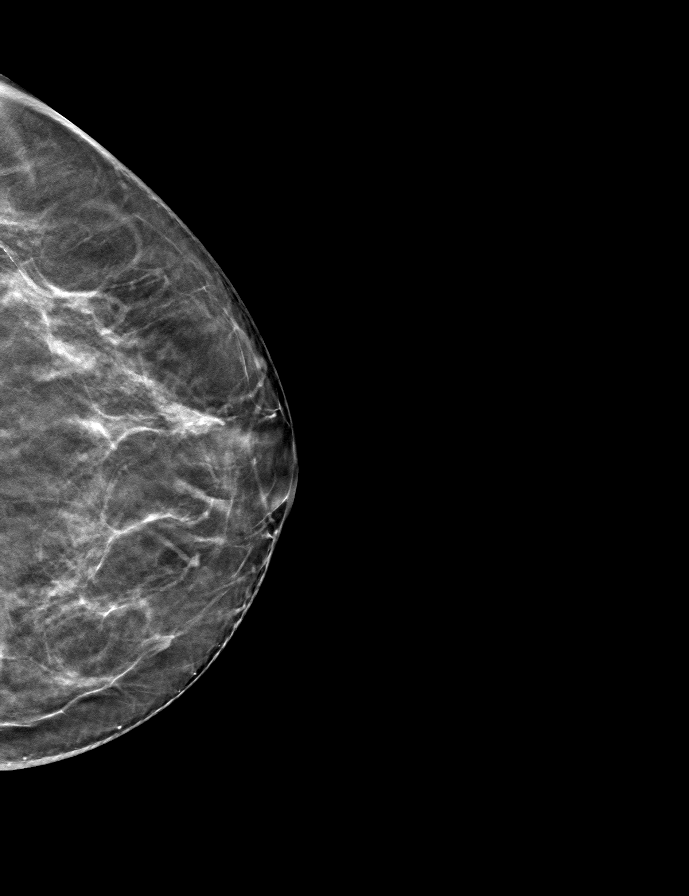

[R MLO tomo · tomo slice 29/58.0]
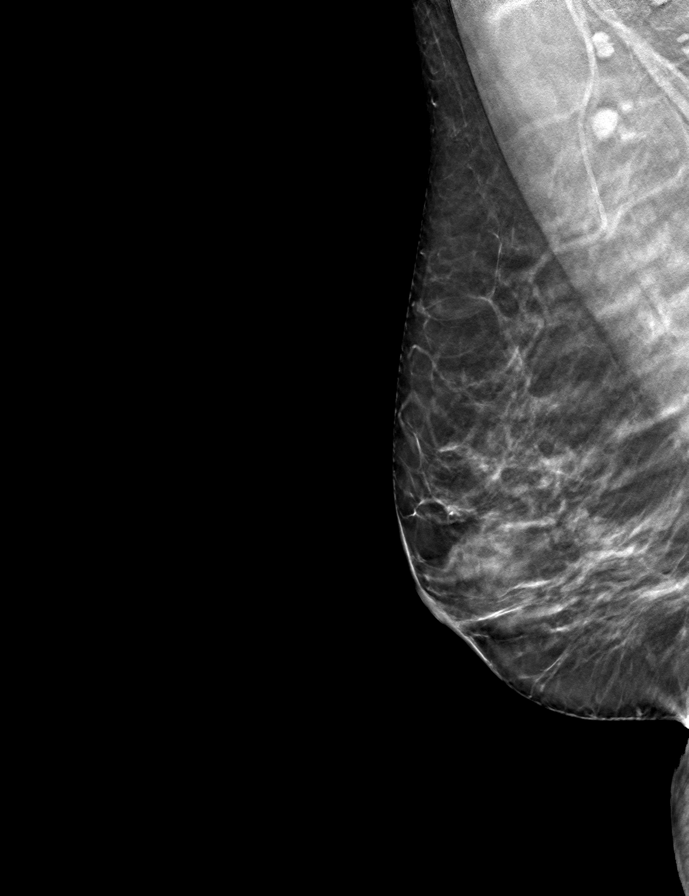

[9 of 24 positions shown; findings below may reference images not displayed]

ACR Breast Density Category b: There are scattered areas of
fibroglandular density.
FINDINGS: There are no findings suspicious for malignancy.
IMPRESSION: No mammographic evidence of malignancy. A result letter of this
screening mammogram will be mailed directly to the patient.

RECOMMENDATION:
Screening mammogram in one year. (Code:51-O-LD2)

BI-RADS CATEGORY  1: Negative.

## 2022-03-20 DIAGNOSIS — Z23 Encounter for immunization: Secondary | ICD-10-CM | POA: Diagnosis not present

## 2022-03-20 DIAGNOSIS — E785 Hyperlipidemia, unspecified: Secondary | ICD-10-CM | POA: Diagnosis not present

## 2022-04-28 DIAGNOSIS — Z20822 Contact with and (suspected) exposure to covid-19: Secondary | ICD-10-CM | POA: Diagnosis not present

## 2022-04-28 DIAGNOSIS — J069 Acute upper respiratory infection, unspecified: Secondary | ICD-10-CM | POA: Diagnosis not present

## 2022-04-28 DIAGNOSIS — B9789 Other viral agents as the cause of diseases classified elsewhere: Secondary | ICD-10-CM | POA: Diagnosis not present

## 2022-04-29 DIAGNOSIS — R051 Acute cough: Secondary | ICD-10-CM | POA: Diagnosis not present

## 2022-04-29 DIAGNOSIS — M791 Myalgia, unspecified site: Secondary | ICD-10-CM | POA: Diagnosis not present

## 2022-04-29 DIAGNOSIS — I10 Essential (primary) hypertension: Secondary | ICD-10-CM | POA: Diagnosis not present

## 2022-04-29 DIAGNOSIS — R519 Headache, unspecified: Secondary | ICD-10-CM | POA: Diagnosis not present

## 2022-04-29 DIAGNOSIS — U071 COVID-19: Secondary | ICD-10-CM | POA: Diagnosis not present

## 2022-04-29 DIAGNOSIS — Z20822 Contact with and (suspected) exposure to covid-19: Secondary | ICD-10-CM | POA: Diagnosis not present

## 2022-05-14 DIAGNOSIS — Z1151 Encounter for screening for human papillomavirus (HPV): Secondary | ICD-10-CM | POA: Diagnosis not present

## 2022-05-14 DIAGNOSIS — Z01419 Encounter for gynecological examination (general) (routine) without abnormal findings: Secondary | ICD-10-CM | POA: Diagnosis not present

## 2022-05-18 DIAGNOSIS — Z1231 Encounter for screening mammogram for malignant neoplasm of breast: Secondary | ICD-10-CM | POA: Diagnosis not present

## 2022-07-05 DIAGNOSIS — M19012 Primary osteoarthritis, left shoulder: Secondary | ICD-10-CM | POA: Diagnosis not present

## 2022-07-05 DIAGNOSIS — M25512 Pain in left shoulder: Secondary | ICD-10-CM | POA: Diagnosis not present

## 2022-07-05 DIAGNOSIS — M7542 Impingement syndrome of left shoulder: Secondary | ICD-10-CM | POA: Diagnosis not present

## 2022-07-05 DIAGNOSIS — M7520 Bicipital tendinitis, unspecified shoulder: Secondary | ICD-10-CM | POA: Diagnosis not present

## 2022-08-29 DIAGNOSIS — J309 Allergic rhinitis, unspecified: Secondary | ICD-10-CM | POA: Diagnosis not present

## 2022-08-29 DIAGNOSIS — J310 Chronic rhinitis: Secondary | ICD-10-CM | POA: Diagnosis not present

## 2022-09-27 DIAGNOSIS — I10 Essential (primary) hypertension: Secondary | ICD-10-CM | POA: Diagnosis not present

## 2022-09-27 DIAGNOSIS — E785 Hyperlipidemia, unspecified: Secondary | ICD-10-CM | POA: Diagnosis not present

## 2022-10-09 DIAGNOSIS — J309 Allergic rhinitis, unspecified: Secondary | ICD-10-CM | POA: Diagnosis not present

## 2022-10-09 DIAGNOSIS — J310 Chronic rhinitis: Secondary | ICD-10-CM | POA: Diagnosis not present

## 2022-11-08 DIAGNOSIS — H5213 Myopia, bilateral: Secondary | ICD-10-CM | POA: Diagnosis not present

## 2023-01-14 ENCOUNTER — Encounter: Payer: Self-pay | Admitting: *Deleted
